# Patient Record
Sex: Female | Born: 1990 | Hispanic: Yes | Marital: Married | Smoking: Never smoker
Health system: Southern US, Community
[De-identification: ages and names within clinical notes are randomized; demographics above are authoritative.]

## PROBLEM LIST (undated history)

## (undated) DIAGNOSIS — Z789 Other specified health status: Secondary | ICD-10-CM

## (undated) DIAGNOSIS — D649 Anemia, unspecified: Secondary | ICD-10-CM

## (undated) HISTORY — DX: Other specified health status: Z78.9

## (undated) HISTORY — DX: Anemia, unspecified: D64.9

---
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
## 2010-08-26 ENCOUNTER — Ambulatory Visit: Payer: Self-pay | Admitting: Internal Medicine

## 2011-03-16 HISTORY — PX: CHOLECYSTECTOMY: SHX55

## 2014-06-22 ENCOUNTER — Emergency Department
Admit: 2014-06-22 | Disposition: A | Discharge status: Home / Self Care | Payer: Self-pay | Admitting: Emergency Medicine

## 2014-06-22 LAB — CBC WITH DIFFERENTIAL/PLATELET
Basophil #: 0 10*3/uL (ref 0.0–0.1)
Basophil %: 0.4 %
EOS ABS: 0.1 10*3/uL (ref 0.0–0.7)
Eosinophil %: 0.9 %
HCT: 41.2 % (ref 35.0–47.0)
HGB: 13.6 g/dL (ref 12.0–16.0)
Lymphocyte #: 5.4 10*3/uL — ABNORMAL HIGH (ref 1.0–3.6)
Lymphocyte %: 49.4 %
MCH: 30 pg (ref 26.0–34.0)
MCHC: 33 g/dL (ref 32.0–36.0)
MCV: 91 fL (ref 80–100)
MONOS PCT: 5.9 %
Monocyte #: 0.6 x10 3/mm (ref 0.2–0.9)
NEUTROS PCT: 43.4 %
Neutrophil #: 4.7 10*3/uL (ref 1.4–6.5)
Platelet: 231 10*3/uL (ref 150–440)
RBC: 4.53 10*6/uL (ref 3.80–5.20)
RDW: 12.6 % (ref 11.5–14.5)
WBC: 10.9 10*3/uL (ref 3.6–11.0)

## 2014-06-22 LAB — URINALYSIS, COMPLETE
Bacteria: NONE SEEN
Bilirubin,UR: NEGATIVE
Blood: NEGATIVE
Glucose,UR: NEGATIVE mg/dL (ref 0–75)
Ketone: NEGATIVE
Leukocyte Esterase: NEGATIVE
Nitrite: NEGATIVE
PROTEIN: NEGATIVE
Ph: 5 (ref 4.5–8.0)
SPECIFIC GRAVITY: 1.02 (ref 1.003–1.030)

## 2014-06-22 LAB — COMPREHENSIVE METABOLIC PANEL
ALBUMIN: 4.9 g/dL
ALK PHOS: 38 U/L
ALT: 18 U/L
ANION GAP: 6 — AB (ref 7–16)
BILIRUBIN TOTAL: 0.4 mg/dL
BUN: 15 mg/dL
CHLORIDE: 105 mmol/L
CO2: 24 mmol/L
CREATININE: 0.51 mg/dL
Calcium, Total: 9.4 mg/dL
EGFR (African American): >60
EGFR (Non-African Amer.): >60
Glucose: 103 mg/dL — ABNORMAL HIGH
Potassium: 3.4 mmol/L — ABNORMAL LOW
SGOT(AST): 19 U/L
Sodium: 135 mmol/L
Total Protein: 8.4 g/dL — ABNORMAL HIGH

## 2014-06-22 LAB — TROPONIN I: Troponin-I: <0.03 ng/mL

## 2014-06-22 LAB — WET PREP, GENITAL

## 2014-06-22 LAB — GC/CHLAMYDIA PROBE AMP

## 2014-06-26 ENCOUNTER — Ambulatory Visit
Admit: 2014-06-26 | Disposition: A | Discharge status: Home / Self Care | Payer: Self-pay | Attending: Internal Medicine | Admitting: Internal Medicine

## 2015-10-08 ENCOUNTER — Other Ambulatory Visit: Payer: Self-pay | Admitting: Family Medicine

## 2015-10-08 ENCOUNTER — Ambulatory Visit
Admission: RE | Admit: 2015-10-08 | Discharge: 2015-10-08 | Disposition: A | Discharge status: Home / Self Care | Payer: Commercial Managed Care - PPO | Source: Ambulatory Visit | Attending: Family Medicine | Admitting: Family Medicine

## 2015-10-08 DIAGNOSIS — R102 Pelvic and perineal pain: Secondary | ICD-10-CM

## 2016-03-15 NOTE — L&D Delivery Note (Signed)
Estimated Date of Delivery: 06/06/16 LMP: 08/31/15 EGA: [redacted]w[redacted]d  Delivery Note At 2:21 PM a viable female was delivered via Vaginal, Spontaneous Delivery (Presentation: LOA ).  APGAR and weight pending as of this note. Placenta status: spontaneous, intact.  Cord: 3 vessels, with the following complications: none.  Cord pH: not collected  Anesthesia:  epidural Episiotomy: None Lacerations:  Right labial Suture Repair: none Est. Blood Loss (mL):  300cc  Mom to postpartum.  Baby to Couplet care / Skin to Skin.  Brittney Robinson Brittney Robinson 05/20/2016, 2:33 PM      Mom presented to L&D with ROM, and without cervical change was augmented with pitocin.  epidual placed. Progressed to complete, second stage: <1hr.  delivery of fetal head with restitution to LOA.   Anterior then posterior shoulders delivered without difficulty.  Due to short cord, it was doubly clamped and cut by FOB.  Baby placed on mom's chest, and attended to by peds.  We sang happy birthday to baby [name].  Placenta spontaneously delivered, intact.   IV pitocin given for hemorrhage prophylaxis.  Right labial laceration, hemostatic, perineum intact.  No sutures.  ----- Larey Days, MD Attending Obstetrician and Gynecologist [facility], Department of [facility]

## 2016-04-22 DIAGNOSIS — Z531 Procedure and treatment not carried out because of patient's decision for reasons of belief and group pressure: Secondary | ICD-10-CM | POA: Insufficient documentation

## 2016-05-20 ENCOUNTER — Inpatient Hospital Stay: Payer: Commercial Managed Care - PPO | Admitting: Certified Registered Nurse Anesthetist

## 2016-05-20 ENCOUNTER — Encounter: Payer: Self-pay | Admitting: *Deleted

## 2016-05-20 ENCOUNTER — Inpatient Hospital Stay
Admission: EM | Admit: 2016-05-20 | Discharge: 2016-05-21 | DRG: 775 | Disposition: A | Discharge status: Home / Self Care | Payer: Commercial Managed Care - PPO | Attending: Obstetrics & Gynecology | Admitting: Obstetrics & Gynecology

## 2016-05-20 DIAGNOSIS — IMO0002 Reserved for concepts with insufficient information to code with codable children: Secondary | ICD-10-CM | POA: Diagnosis present

## 2016-05-20 DIAGNOSIS — Z3A37 37 weeks gestation of pregnancy: Secondary | ICD-10-CM

## 2016-05-20 DIAGNOSIS — O4202 Full-term premature rupture of membranes, onset of labor within 24 hours of rupture: Principal | ICD-10-CM | POA: Diagnosis present

## 2016-05-20 HISTORY — DX: Other specified health status: Z78.9

## 2016-05-20 LAB — CBC
HCT: 33.6 % — ABNORMAL LOW (ref 35.0–47.0)
Hemoglobin: 11.4 g/dL — ABNORMAL LOW (ref 12.0–16.0)
MCH: 28.7 pg (ref 26.0–34.0)
MCHC: 34 g/dL (ref 32.0–36.0)
MCV: 84.5 fL (ref 80.0–100.0)
Platelets: 245 K/uL (ref 150–440)
RBC: 3.97 MIL/uL (ref 3.80–5.20)
RDW: 15.9 % — ABNORMAL HIGH (ref 11.5–14.5)
WBC: 9.9 K/uL (ref 3.6–11.0)

## 2016-05-20 LAB — RAPID HIV SCREEN (HIV 1/2 AB+AG)
HIV 1/2 Antibodies: NONREACTIVE
HIV-1 P24 Antigen - HIV24: NONREACTIVE

## 2016-05-20 LAB — TYPE AND SCREEN
ABO/RH(D): B POS
Antibody Screen: NEGATIVE

## 2016-05-20 MED ORDER — ONDANSETRON HCL 4 MG/2ML IJ SOLN
4.0000 mg | Freq: Four times a day (QID) | INTRAMUSCULAR | Status: DC | PRN
  Filled 2016-05-20: qty 2

## 2016-05-20 MED ORDER — ACETAMINOPHEN 500 MG PO TABS: 1000.0000 mg | ORAL_TABLET | Freq: Four times a day (QID) | ORAL | Status: DC | PRN

## 2016-05-20 MED ORDER — SOD CITRATE-CITRIC ACID 500-334 MG/5ML PO SOLN
30.0000 mL | ORAL | Status: DC | PRN
  Filled 2016-05-20: qty 30

## 2016-05-20 MED ORDER — COCONUT OIL OIL
1.0000 "application " | TOPICAL_OIL | Status: DC | PRN
  Administered 2016-05-21: 1 via TOPICAL
  Filled 2016-05-20: qty 120

## 2016-05-20 MED ORDER — LIDOCAINE HCL (PF) 1 % IJ SOLN
INTRAMUSCULAR | Status: DC | PRN
  Administered 2016-05-20: 3 mL

## 2016-05-20 MED ORDER — LIDOCAINE HCL (PF) 1 % IJ SOLN: 30.0000 mL | INTRAMUSCULAR | Status: DC | PRN

## 2016-05-20 MED ORDER — SIMETHICONE 80 MG PO CHEW: 80.0000 mg | CHEWABLE_TABLET | ORAL | Status: DC | PRN

## 2016-05-20 MED ORDER — BUPIVACAINE HCL (PF) 0.25 % IJ SOLN
INTRAMUSCULAR | Status: DC | PRN
  Administered 2016-05-20 (×2): 4 mL via EPIDURAL

## 2016-05-20 MED ORDER — ONDANSETRON HCL 4 MG PO TABS
4.0000 mg | ORAL_TABLET | ORAL | Status: DC | PRN
Start: 2016-05-20 — End: 2016-05-21

## 2016-05-20 MED ORDER — DIPHENHYDRAMINE HCL 25 MG PO CAPS: 25.0000 mg | ORAL_CAPSULE | Freq: Four times a day (QID) | ORAL | Status: DC | PRN

## 2016-05-20 MED ORDER — LACTATED RINGERS IV SOLN: 500.0000 mL | INTRAVENOUS | Status: DC | PRN

## 2016-05-20 MED ORDER — IBUPROFEN 600 MG PO TABS
600.0000 mg | ORAL_TABLET | Freq: Four times a day (QID) | ORAL | Status: DC
  Administered 2016-05-20 – 2016-05-21 (×4): 600 mg via ORAL
  Filled 2016-05-20 (×4): qty 1

## 2016-05-20 MED ORDER — OXYTOCIN 40 UNITS IN LACTATED RINGERS INFUSION - SIMPLE MED
2.5000 [IU]/h | INTRAVENOUS | Status: DC
  Filled 2016-05-20: qty 1000

## 2016-05-20 MED ORDER — ACETAMINOPHEN 325 MG PO TABS: 650.0000 mg | ORAL_TABLET | ORAL | Status: DC | PRN

## 2016-05-20 MED ORDER — DIBUCAINE 1 % RE OINT: 1.0000 "application " | TOPICAL_OINTMENT | RECTAL | Status: DC | PRN

## 2016-05-20 MED ORDER — PRENATAL MULTIVITAMIN CH
1.0000 | ORAL_TABLET | Freq: Every day | ORAL | Status: DC
  Administered 2016-05-21: 1 via ORAL
  Filled 2016-05-20: qty 1

## 2016-05-20 MED ORDER — LACTATED RINGERS IV SOLN: INTRAVENOUS | Status: DC

## 2016-05-20 MED ORDER — OXYTOCIN BOLUS FROM INFUSION: 500.0000 mL | Freq: Once | INTRAVENOUS | Status: DC

## 2016-05-20 MED ORDER — FENTANYL 2.5 MCG/ML W/ROPIVACAINE 0.2% IN NS 100 ML EPIDURAL INFUSION (ARMC-ANES)
EPIDURAL | Status: AC
  Filled 2016-05-20: qty 100

## 2016-05-20 MED ORDER — FENTANYL 2.5 MCG/ML W/ROPIVACAINE 0.2% IN NS 100 ML EPIDURAL INFUSION (ARMC-ANES)
EPIDURAL | Status: DC | PRN
  Administered 2016-05-20: 10 mL/h via EPIDURAL

## 2016-05-20 MED ORDER — TETANUS-DIPHTH-ACELL PERTUSSIS 5-2.5-18.5 LF-MCG/0.5 IM SUSP: 0.5000 mL | Freq: Once | INTRAMUSCULAR | Status: DC

## 2016-05-20 MED ORDER — BUTORPHANOL TARTRATE 1 MG/ML IJ SOLN: 1.0000 mg | INTRAMUSCULAR | Status: DC | PRN

## 2016-05-20 MED ORDER — DOCUSATE SODIUM 100 MG PO CAPS
100.0000 mg | ORAL_CAPSULE | Freq: Two times a day (BID) | ORAL | Status: DC
  Administered 2016-05-20 – 2016-05-21 (×2): 100 mg via ORAL
  Filled 2016-05-20 (×2): qty 1

## 2016-05-20 MED ORDER — ONDANSETRON HCL 4 MG/2ML IJ SOLN
4.0000 mg | INTRAMUSCULAR | Status: DC | PRN
  Administered 2016-05-20: 4 mg via INTRAVENOUS

## 2016-05-20 MED ORDER — WITCH HAZEL-GLYCERIN EX PADS: 1.0000 "application " | MEDICATED_PAD | CUTANEOUS | Status: DC | PRN

## 2016-05-20 NOTE — Anesthesia Procedure Notes (Signed)
Epidural Patient location during procedure: OB Start time: 05/20/2016 12:15 PM End time: 05/20/2016 12:25 PM  Staffing Anesthesiologist: Alvin Critchley Resident/CRNA: Johnna Acosta Performed: resident/CRNA   Preanesthetic Checklist Completed: patient identified, site marked, surgical consent, pre-op evaluation, timeout performed, IV checked, risks and benefits discussed and monitors and equipment checked  Epidural Patient position: sitting Prep: Betadine Patient monitoring: heart rate, continuous pulse ox and blood pressure Approach: midline Location: L4-L5 Injection technique: LOR saline  Needle:  Needle type: Tuohy  Needle gauge: 17 G Needle length: 9 cm and 9 Needle insertion depth: 7 cm Catheter type: closed end flexible Catheter size: 19 Gauge Catheter at skin depth: 13 cm Test dose: negative and 1.5% lidocaine with Epi 1:200 K  Assessment Sensory level: T10 Events: blood not aspirated, injection not painful, no injection resistance, negative IV test and no paresthesia  Additional Notes Pt. Evaluated and documentation done after procedure finished. Patient identified. Risks/Benefits/Options discussed with patient including but not limited to bleeding, infection, nerve damage, paralysis, failed block, incomplete pain control, headache, blood pressure changes, nausea, vomiting, reactions to medication both or allergic, itching and postpartum back pain. Confirmed with bedside nurse the patient's most recent platelet count. Confirmed with patient that they are not currently taking any anticoagulation, have any bleeding history or any family history of bleeding disorders. Patient expressed understanding and wished to proceed. All questions were answered. Sterile technique was used throughout the entire procedure. Please see nursing notes for vital signs. Test dose was given through epidural catheter and negative prior to continuing to dose epidural or start infusion. Warning  signs of high block given to the patient including shortness of breath, tingling/numbness in hands, complete motor block, or any concerning symptoms with instructions to call for help. Patient was given instructions on fall risk and not to get out of bed. All questions and concerns addressed with instructions to call with any issues or inadequate analgesia.   Patient tolerated the insertion well without immediate complications.Reason for block:procedure for pain

## 2016-05-20 NOTE — H&P (Signed)
Brittney Robinson is a 26 y.o. female presenting for SROM at 0530 with pink tinged fluid. Pt is a JEHOVAHS WITNESS AND HAS DECLINED BLOOD IN CASE OF HEMORRHAGE! PNC at [facility] significant for unknown LMP with [place] on 10/08/15 with IUP at 5 3/7 weeks and EDD of 06/06/16. Son has been sick at home with fever and poss flu.  OB History    Gravida Para Term Preterm AB Living   2 1 1     1    SAB TAB Ectopic Multiple Live Births           1     Past Medical History:  Diagnosis Date  . Medical history non-contributory    Anemia Past Surgical History:  Procedure Laterality Date  . CHOLECYSTECTOMY     Family History: family history includes Heart disease in her maternal grandfather, maternal grandmother, paternal grandfather, and paternal grandmother. Social History:  reports that she has never smoked. She has never used smokeless tobacco. She reports that she does not drink alcohol or use drugs.     Maternal Diabetes: 38 Genetic Screening: declined Maternal Ultrasounds/Referrals: normal anatomy Fetal Ultrasounds or other Referrals: none Maternal Substance Abuse:  None Significant Maternal Medications: PNV, FE Significant Maternal Lab Results:NOne Other Comments:  Jehovah Witness  Review of Systems  Constitutional: Negative.   HENT: Negative.   Eyes: Negative.   Respiratory: Negative.   Cardiovascular: Negative.   Gastrointestinal: Negative.   Genitourinary: Negative.   Musculoskeletal: Negative.   Skin: Negative.   Neurological: Negative.   Endo/Heme/Allergies: Negative.   Psychiatric/Behavioral: Negative.    History   Exam Physical Exam  Gen: 26 yo Hispanic female in NAD. HEENT: Eyes non-icteric, Normocephalic Lungs: CTA bilat, no W/R/R. Heart: S1S2, RRR, No M/R/G Abd: Gravid Cx: 3cms per RN Extrems: 1+ edema. Prenatal labs: ABO, Rh:  B pos Antibody:  Neg Rubella:  Immune RPR:   NR HBsAg:   Neg HIV:   Neg GBS:   neg  Assessment/Plan: 1. IUP at term with  SROM 2. GBs neg 3. JEHOVAH WITNESS (DECLINES BLOOD) P: Admit to Birthplace for delivery 2. Continue to monitor UC/FHT 3. Continue to monitor VS. 4. Anticipate SVd.   Catheryn Bacon 05/20/2016, 8:05 AM

## 2016-05-20 NOTE — Discharge Summary (Signed)
Obstetrical Discharge Summary  Patient Name: Brittney Robinson DOB: 1990-06-25 MRN: [id number]   Date of Admission: 05/20/2016 Date of Delivery: 05/20/16 Delivered by: Larey Days Date of Discharge: 05/21/2016  Primary OB: [facility]   Gestational Age at Delivery: [redacted]w[redacted]d LMP 08/31/2015. Estimated Date of Delivery: 06/06/16    Antepartum complications: Jehovah's Witness, does not accept blood products.  Son recently diagnosed with flu Admitting Diagnosis: rupture of membranes Secondary Diagnosis: Patient Active Problem List   Diagnosis Date Noted  . Indication for care in labor or delivery 05/20/2016  . Rupture of membranes with clear amniotic fluid 05/20/2016    Augmentation: Pitocin Complications: None Intrapartum complications/course: Mom presented to L&D with ROM, and without cervical change was augmented with pitocin.  epidual placed. Progressed to complete, second stage: <1hr.  delivery of fetal head with restitution to LOA.   Anterior then posterior shoulders delivered without difficulty.  Due to short cord, it was doubly clamped and cut by FOB.  Baby placed on mom's chest, and attended to by peds.  We sang happy birthday to baby [name].  Placenta spontaneously delivered, intact.   IV pitocin given for hemorrhage prophylaxis.  Right labial laceration, hemostatic, perineum intact.  No sutures. Date of Delivery: 05/20/16 Delivered By: Vikki Ports Ward Delivery Type: spontaneous vaginal delivery Anesthesia: epidural Placenta: sponatneous Laceration: right labial, hemostatic Episiotomy: none Newborn Data: Live born female  Birth Weight:  6lb 8oz [phone]) APGAR: 9, 9  Postpartum Procedures: None  Post partum course:  Patient had an uncomplicated postpartum course.  By time of discharge on PPD#1, her pain was controlled on oral pain medications; she had appropriate lochia and was ambulating, voiding without difficulty and tolerating regular diet.  She was deemed stable  for discharge to home.    Discharge Physical Exam:  BP 104/67 (BP Location: Right Arm)   Pulse 88   Temp 98.4 F (36.9 C) (Oral)   Resp 18   Ht 4\' 9"  (1.448 m)   Wt 161 lb (73 kg)   LMP 08/31/2015   SpO2 99%   Breastfeeding? Unknown   BMI 34.84 kg/m   General: NAD CV: RRR Pulm: CTABL, nl effort ABD: s/nd/nt, fundus firm and below the umbilicus Lochia: moderate DVT Evaluation: LE non-ttp, no evidence of DVT on exam.  Hemoglobin  Date Value Ref Range Status  05/21/2016 10.7 (L) 12.0 - 16.0 g/dL Final   HGB  Date Value Ref Range Status  06/22/2014 13.6 12.0 - 16.0 g/dL Final   HCT  Date Value Ref Range Status  05/21/2016 32.2 (L) 35.0 - 47.0 % Final  06/22/2014 41.2 35.0 - 47.0 % Final     Disposition: stable, discharge to home. Baby Feeding: breastmilk Baby Disposition: home with mom  Rh Immune globulin given: n/a Rubella vaccine given: n/a Tdap vaccine given in AP or PP setting: AP Flu vaccine given in AP or PP setting: declined  Contraception: LARC vs interval BTL  Prenatal Labs:  ABO, Rh:  B pos Antibody:  Neg Rubella:  Immune RPR:   NR HBsAg:   Neg HIV:   Neg GBS:   neg   Plan:  Jenel Galindo-Palestino was discharged to home in good condition. Follow-up appointment with Dr. Leonides Schanz at [facility] in 6 weeks   Discharge Medications: Allergies as of 05/21/2016   No Known Allergies     Medication List    TAKE these medications   docusate sodium 100 MG capsule Commonly known as:  COLACE Take 1 capsule (100 mg  total) by mouth daily as needed for mild constipation. To keep stools soft   ferrous sulfate 325 (65 FE) MG tablet Take 325 mg by mouth daily with breakfast.   ibuprofen 800 MG tablet Commonly known as:  ADVIL,MOTRIN Take 1 tablet (800 mg total) by mouth every 8 (eight) hours as needed for moderate pain.   multivitamin-prenatal 27-0.8 MG Tabs tablet Take 1 tablet by mouth daily at 12 noon.         SignedBenjaman Kindler 6:03 PM

## 2016-05-20 NOTE — Anesthesia Preprocedure Evaluation (Signed)
Anesthesia Evaluation  Patient identified by MRN, date of birth, ID band Patient awake    Reviewed: Allergy & Precautions, H&P , NPO status , Patient's Chart, lab work & pertinent test results  Airway Mallampati: II  TM Distance: <3 FB   Mouth opening: Limited Mouth Opening  Dental  (+) Teeth Intact   Pulmonary    Pulmonary exam normal        Cardiovascular Exercise Tolerance: Good negative cardio ROS Normal cardiovascular exam     Neuro/Psych    GI/Hepatic negative GI ROS, Neg liver ROS,   Endo/Other  negative endocrine ROS  Renal/GU negative Renal ROS     Musculoskeletal   Abdominal   Peds  Hematology negative hematology ROS (+)   Anesthesia Other Findings   Reproductive/Obstetrics (+) Pregnancy                             Anesthesia Physical Anesthesia Plan  ASA: II  Anesthesia Plan: Epidural   Post-op Pain Management:    Induction:   Airway Management Planned:   Additional Equipment:   Intra-op Plan:   Post-operative Plan:   Informed Consent: I have reviewed the patients History and Physical, chart, labs and discussed the procedure including the risks, benefits and alternatives for the proposed anesthesia with the patient or authorized representative who has indicated his/her understanding and acceptance.   Dental Advisory Given  Plan Discussed with: Anesthesiologist  Anesthesia Plan Comments:         Anesthesia Quick Evaluation

## 2016-05-21 LAB — CBC
HEMATOCRIT: 32.2 % — AB (ref 35.0–47.0)
HEMOGLOBIN: 10.7 g/dL — AB (ref 12.0–16.0)
MCH: 28.5 pg (ref 26.0–34.0)
MCHC: 33.2 g/dL (ref 32.0–36.0)
MCV: 86 fL (ref 80.0–100.0)
Platelets: 188 10*3/uL (ref 150–440)
RBC: 3.75 MIL/uL — AB (ref 3.80–5.20)
RDW: 16.5 % — ABNORMAL HIGH (ref 11.5–14.5)
WBC: 6.7 10*3/uL (ref 3.6–11.0)

## 2016-05-21 LAB — RPR: RPR: NONREACTIVE

## 2016-05-21 MED ORDER — IBUPROFEN 800 MG PO TABS: 800.0000 mg | ORAL_TABLET | Freq: Three times a day (TID) | ORAL | 0 refills | Status: DC | PRN

## 2016-05-21 MED ORDER — DOCUSATE SODIUM 100 MG PO CAPS: 100.0000 mg | ORAL_CAPSULE | Freq: Every day | ORAL | 0 refills | Status: DC | PRN

## 2016-05-21 NOTE — Anesthesia Postprocedure Evaluation (Signed)
Anesthesia Post Note  Patient: Brittney Robinson  Procedure(s) Performed: * No procedures listed *  Patient location during evaluation: Mother Baby Anesthesia Type: Epidural Level of consciousness: awake, awake and alert and oriented Pain management: pain level controlled Vital Signs Assessment: post-procedure vital signs reviewed and stable Respiratory status: spontaneous breathing Cardiovascular status: stable Postop Assessment: no backache, patient able to bend at knees and adequate PO intake Anesthetic complications: no     Last Vitals:  Vitals:   05/20/16 2346 05/21/16 0432  BP: 128/64 (!) 105/57  Pulse: 97 83  Resp: 18 20  Temp: 36.8 C 37 C    Last Pain:  Vitals:   05/21/16 0432  TempSrc: Oral  PainSc:                  [name]

## 2016-05-21 NOTE — Progress Notes (Signed)
Post Partum Day #1 NSVD Subjective: no complaints, up ad lib, voiding and tolerating PO  Objective: Blood pressure 125/64, pulse 80, temperature 99.1 F (37.3 C), temperature source Oral, resp. rate 18, height 4\' 9"  (1.448 m), weight 161 lb (73 kg), last menstrual period 08/31/2015, SpO2 99 %, unknown if currently breastfeeding.  Physical Exam:  General: alert, cooperative and appears stated age Lochia: appropriate Uterine Fundus: firm Incision: none DVT Evaluation: No evidence of DVT seen on physical exam. Negative Homan's sign.   Recent Labs  05/20/16 0813 05/21/16 0501  HGB 11.4* 10.7*  HCT 33.6* 32.2*    Assessment/Plan: Plan for discharge tomorrow and Breastfeeding  Possible BTL   LOS: 1 day   Brittney Robinson 05/21/2016, 11:05 AM

## 2016-05-21 NOTE — Discharge Instructions (Signed)
Please call your doctor or return to the ER if you experience any chest pains, dizziness, shortness of breath, fever greater than 101, any heavy bleeding (saturating more than 1 pad per hour), large clots, or foul smelling discharge, any worsening abdominal pain and cramping that is not controlled by pain medication, or any signs of postpartum depression. No tampons, enemas, douches, or sexual intercourse for 6 weeks. Also avoid tub baths, hot tubs, or swimming for 6 weeks.    Care After Vaginal Delivery Congratulations on your new baby!!  Refer to this sheet in the next few weeks. These discharge instructions provide you with information on caring for yourself after delivery. Your caregiver may also give you specific instructions. Your treatment has been planned according to the most current medical practices available, but problems sometimes occur. Call your caregiver if you have any problems or questions after you go home.  HOME CARE INSTRUCTIONS  Take over-the-counter or prescription medicines only as directed by your caregiver or pharmacist.  Do not drink alcohol, especially if you are breastfeeding or taking medicine to relieve pain.  Do not chew or smoke tobacco.  Do not use illegal drugs.  Continue to use good perineal care. Good perineal care includes:  Wiping your perineum from front to back.  Keeping your perineum clean.  Do not use tampons or douche until your caregiver says it is okay.  Shower, wash your hair, and take tub baths as directed by your caregiver.  Wear a well-fitting bra that provides breast support.  Eat healthy foods.  Drink enough fluids to keep your urine clear or pale yellow.  Eat high-fiber foods such as whole grain cereals and breads, brown rice, beans, and fresh fruits and vegetables every day. These foods may help prevent or relieve constipation.  Follow your caregiver's recommendations regarding resumption of activities such as climbing stairs,  driving, lifting, exercising, or traveling. Specifically, no driving for two weeks, so that you are comfortable reacting quickly in an emergency.  Talk to your caregiver about resuming sexual activities. Resumption of sexual activities is dependent upon your risk of infection, your rate of healing, and your comfort and desire to resume sexual activity. Usually we recommend waiting about six weeks, or until your bleeding stops and you are interested in sex.  Try to have someone help you with your household activities and your newborn for at least a few days after you leave the hospital. Even longer is better.  Rest as much as possible. Try to rest or take a nap when your newborn is sleeping. Sleep deprivation can be very hard after delivery.  Increase your activities gradually.  Keep all of your scheduled postpartum appointments. It is very important to keep your scheduled follow-up appointments. At these appointments, your caregiver will be checking to make sure that you are healing physically and emotionally.  SEEK MEDICAL CARE IF:   You are passing large clots from your vagina.   You have a foul smelling discharge from your vagina.  You have trouble urinating.  You are urinating frequently.  You have pain when you urinate.  You have a change in your bowel movements.  You have increasing redness, pain, or swelling near your vaginal incision (episiotomy) or vaginal tear.  You have pus draining from your episiotomy or vaginal tear.  Your episiotomy or vaginal tear is separating.  You have painful, hard, or reddened breasts.  You have a severe headache.  You have blurred vision or see spots.  You feel  sad or depressed.  You have thoughts of hurting yourself or your newborn.  You have questions about your care, the care of your newborn, or medicines.  You are dizzy or light-headed.  You have a rash.  You have nausea or vomiting.  You were breastfeeding and have not had  a menstrual period within 12 weeks after you stopped breastfeeding.  You are not breastfeeding and have not had a menstrual period by the 12th week after delivery.  You have a fever.  SEEK IMMEDIATE MEDICAL CARE IF:   You have persistent pain.  You have chest pain.  You have shortness of breath.  You faint.  You have leg pain.  You have stomach pain.  Your vaginal bleeding saturates two or more sanitary pads in 1 hour.  MAKE SURE YOU:   Understand these instructions.  Will get help right away if you are not doing well or get worse.   Document Released: 02/27/2000 Document Revised: 07/16/2013 Document Reviewed: 10/27/2011  [facility] Patient Information 2015 [place], [place]. This information is not intended to replace advice given to you by your health care provider. Make sure you discuss any questions you have with your health care provider.

## 2016-05-21 NOTE — Progress Notes (Signed)
Discharge order received from doctor. Reviewed discharge instructions and prescriptions with patient and answered all questions. Follow up appointment instructions given. Patient verbalized understanding. ID bands checked. Patient discharged home with infant via wheelchair by nursing/auxillary.    Veronia Laprise Garner, RN  

## 2016-12-01 DIAGNOSIS — D649 Anemia, unspecified: Secondary | ICD-10-CM | POA: Insufficient documentation

## 2018-05-10 ENCOUNTER — Other Ambulatory Visit (HOSPITAL_COMMUNITY)
Admission: RE | Admit: 2018-05-10 | Discharge: 2018-05-10 | Disposition: A | Discharge status: Home / Self Care | Payer: Commercial Managed Care - PPO | Source: Ambulatory Visit | Attending: Family Medicine | Admitting: Family Medicine

## 2018-05-10 ENCOUNTER — Ambulatory Visit: Payer: Commercial Managed Care - PPO | Admitting: Family Medicine

## 2018-05-10 ENCOUNTER — Encounter: Payer: Self-pay | Admitting: Family Medicine

## 2018-05-10 VITALS — BP 120/76 | HR 99 | Temp 98.5°F | Resp 12 | Ht 58.5 in | Wt 138.3 lb

## 2018-05-10 DIAGNOSIS — Z1159 Encounter for screening for other viral diseases: Secondary | ICD-10-CM

## 2018-05-10 DIAGNOSIS — Z113 Encounter for screening for infections with a predominantly sexual mode of transmission: Secondary | ICD-10-CM | POA: Insufficient documentation

## 2018-05-10 DIAGNOSIS — Z131 Encounter for screening for diabetes mellitus: Secondary | ICD-10-CM | POA: Diagnosis not present

## 2018-05-10 DIAGNOSIS — Z01419 Encounter for gynecological examination (general) (routine) without abnormal findings: Secondary | ICD-10-CM | POA: Diagnosis not present

## 2018-05-10 DIAGNOSIS — Z130 Encounter for screening for diseases of the blood and blood-forming organs and certain disorders involving the immune mechanism: Secondary | ICD-10-CM

## 2018-05-10 DIAGNOSIS — Z13220 Encounter for screening for lipoid disorders: Secondary | ICD-10-CM

## 2018-05-10 DIAGNOSIS — N941 Unspecified dyspareunia: Secondary | ICD-10-CM

## 2018-05-10 DIAGNOSIS — D229 Melanocytic nevi, unspecified: Secondary | ICD-10-CM | POA: Insufficient documentation

## 2018-05-10 NOTE — Patient Instructions (Signed)
Preventive Care 18-39 Years, Female Preventive care refers to lifestyle choices and visits with your health care provider that can promote health and wellness. What does preventive care include?   A yearly physical exam. This is also called an annual well check.  Dental exams once or twice a year.  Routine eye exams. Ask your health care provider how often you should have your eyes checked.  Personal lifestyle choices, including: ? Daily care of your teeth and gums. ? Regular physical activity. ? Eating a healthy diet. ? Avoiding tobacco and drug use. ? Limiting alcohol use. ? Practicing safe sex. ? Taking vitamin and mineral supplements as recommended by your health care provider. What happens during an annual well check? The services and screenings done by your health care provider during your annual well check will depend on your age, overall health, lifestyle risk factors, and family history of disease. Counseling Your health care provider may ask you questions about your:  Alcohol use.  Tobacco use.  Drug use.  Emotional well-being.  Home and relationship well-being.  Sexual activity.  Eating habits.  Work and work environment.  Method of birth control.  Menstrual cycle.  Pregnancy history. Screening You may have the following tests or measurements:  Height, weight, and BMI.  Diabetes screening. This is done by checking your blood sugar (glucose) after you have not eaten for a while (fasting).  Blood pressure.  Lipid and cholesterol levels. These may be checked every 5 years starting at age 20.  Skin check.  Hepatitis C blood test.  Hepatitis B blood test.  Sexually transmitted disease (STD) testing.  BRCA-related cancer screening. This may be done if you have a family history of breast, ovarian, tubal, or peritoneal cancers.  Pelvic exam and Pap test. This may be done every 3 years starting at age 21. Starting at age 30, this may be done every 5  years if you have a Pap test in combination with an HPV test. Discuss your test results, treatment options, and if necessary, the need for more tests with your health care provider. Vaccines Your health care provider may recommend certain vaccines, such as:  Influenza vaccine. This is recommended every year.  Tetanus, diphtheria, and acellular pertussis (Tdap, Td) vaccine. You may need a Td booster every 10 years.  Varicella vaccine. You may need this if you have not been vaccinated.  HPV vaccine. If you are 26 or younger, you may need three doses over 6 months.  Measles, mumps, and rubella (MMR) vaccine. You may need at least one dose of MMR. You may also need a second dose.  Pneumococcal 13-valent conjugate (PCV13) vaccine. You may need this if you have certain conditions and were not previously vaccinated.  Pneumococcal polysaccharide (PPSV23) vaccine. You may need one or two doses if you smoke cigarettes or if you have certain conditions.  Meningococcal vaccine. One dose is recommended if you are age 19-21 years and a first-year college student living in a residence hall, or if you have one of several medical conditions. You may also need additional booster doses.  Hepatitis A vaccine. You may need this if you have certain conditions or if you travel or work in places where you may be exposed to hepatitis A.  Hepatitis B vaccine. You may need this if you have certain conditions or if you travel or work in places where you may be exposed to hepatitis B.  Haemophilus influenzae type b (Hib) vaccine. You may need this if you   have certain risk factors. Talk to your health care provider about which screenings and vaccines you need and how often you need them. This information is not intended to replace advice given to you by your health care provider. Make sure you discuss any questions you have with your health care provider. Document Released: 04/27/2001 Document Revised: 10/12/2016  Document Reviewed: 12/31/2014 Elsevier Interactive Patient Education  2019 [employer].

## 2018-05-10 NOTE — Progress Notes (Signed)
Name: Brittney Robinson   MRN: [id number]    DOB: 02-17-1991   Date:05/10/2018       Progress Note  Subjective  Chief Complaint  Chief Complaint  Patient presents with  . Establish Care    HPI   Patient presents for annual CPE   Diet: high in carbohydrates, discussed more vegetables , change to fruit instead of dessert  Exercise: needs to exercise 150 minutes per week   USPSTF grade A and B recommendations    Office Visit from 05/10/2018 in [facility]  AUDIT-C Score  0     Depression:  Depression screen [facility] 2/9 05/10/2018  Decreased Interest 0  Down, Depressed, Hopeless 0  PHQ - 2 Score 0  Altered sleeping 0  Tired, decreased energy 1  Change in appetite 1  Feeling bad or failure about yourself  0  Trouble concentrating 0  Moving slowly or fidgety/restless 0  Suicidal thoughts 0  PHQ-9 Score 2  Difficult doing work/chores Not difficult at all   Hypertension: BP Readings from Last 3 Encounters:  05/10/18 120/76  05/21/16 104/67   Obesity: Wt Readings from Last 3 Encounters:  05/10/18 138 lb 4.8 oz (62.7 kg)  05/20/16 161 lb (73 kg)   BMI Readings from Last 3 Encounters:  05/10/18 28.41 kg/m  05/20/16 34.84 kg/m    Hep C Screening: today  STD testing and prevention (HIV/chl/gon/syphilis): today  Intimate partner violence: negative screen  Sexual History/Pain during Intercourse: she states it is painful during intercourse, worse since delivery of last child 2 years ago, discussed referring her back Menstrual History/LMP/Abnormal Bleeding: today, regular cycles Incontinence Symptoms: negative   Advanced Care Planning: A voluntary discussion about advance care planning including the explanation and discussion of advance directives.  Discussed health care proxy and Living will, and the patient was able to identify a health care proxy as husband .  Patient does not have a living will at present time.   BRCA gene screening:  N/A Cervical cancer screening: up to date  Osteoporosis Screening: discussed high calcium and vitamin  D diet    Glucose:  Glucose  Date Value Ref Range Status  06/22/2014 103 (H) mg/dL Final    Comment:    65-99 NOTE: New Reference Range  05/21/14     Skin cancer: discussed atypical lesions    Patient Active Problem List   Diagnosis Date Noted  . Chronic anemia 12/01/2016  . Refusal of blood transfusions as patient is Jehovah's Witness 04/22/2016    Past Surgical History:  Procedure Laterality Date  . CHOLECYSTECTOMY      Family History  Problem Relation Age of Onset  . Heart disease Maternal Grandmother   . Heart disease Maternal Grandfather   . Heart disease Paternal Grandmother   . Hypertension Paternal Grandmother   . Heart disease Paternal Grandfather   . Allergies Father   . Bipolar disorder Sister   . Obesity Maternal Aunt   . Hypertension Maternal Aunt   . Schizophrenia Maternal Aunt   . Obesity Paternal Uncle     Social History   Socioeconomic History  . Marital status: Married    Spouse name: Jose   . Number of children: 2  . Years of education: Not on file  . Highest education level: 12th grade  Occupational History  . Occupation: CNA  Social Needs  . Financial resource strain: Not hard at all  . Food insecurity:    Worry: Never true    Inability:  Never true  . Transportation needs:    Medical: No    Non-medical: No  Tobacco Use  . Smoking status: Never Smoker  . Smokeless tobacco: Never Used  Substance and Sexual Activity  . Alcohol use: No  . Drug use: No  . Sexual activity: Yes    Partners: Male    Birth control/protection: Condom  Lifestyle  . Physical activity:    Days per week: 0 days    Minutes per session: 0 min  . Stress: Not at all  Relationships  . Social connections:    Talks on phone: More than three times a week    Gets together: More than three times a week    Attends religious service: More than 4 times per  year    Active member of club or organization: Yes    Attends meetings of clubs or organizations: More than 4 times per year    Relationship status: Married  . Intimate partner violence:    Fear of current or ex partner: No    Emotionally abused: No    Physically abused: No    Forced sexual activity: No  Other Topics Concern  . Not on file  Social History Narrative   Born in [place], both parents from [place].    Moved to [place] with parents around age 10   She [place] in [place] from [place] and they have 2 sons    No current outpatient medications on file.  No Known Allergies   ROS  Constitutional: Negative for fever or weight change.  Respiratory: positive  for cough ( past couple of weeks but improving - had an URI)  But no  shortness of breath.   Cardiovascular: Negative for chest pain or palpitations.  Gastrointestinal: Negative for abdominal pain, no bowel changes.  Musculoskeletal: Negative for gait problem or joint swelling.  Skin: Negative for rash.  Neurological: Negative for dizziness or headache.  No other specific complaints in a complete review of systems (except as listed in HPI above).  Objective  Vitals:   05/10/18 1114  BP: 120/76  Pulse: 99  Resp: 12  Temp: 98.5 F (36.9 C)  TempSrc: Oral  SpO2: 98%  Weight: 138 lb 4.8 oz (62.7 kg)  Height: 4' 10.5" (1.486 m)    Body mass index is 28.41 kg/m.  Physical Exam  Constitutional: Patient appears well-developed and well-nourished. No distress.  HENT: Head: Normocephalic and atraumatic. Ears: B TMs ok, no erythema or effusion; Nose: Nose normal. Mouth/Throat: Oropharynx is clear and moist. No oropharyngeal exudate.  Eyes: Conjunctivae and EOM are normal. Pupils are equal, round, and reactive to light. No scleral icterus.  Neck: Normal range of motion. Neck supple. No JVD present. No thyromegaly present.  Cardiovascular: Normal rate, regular rhythm and normal heart sounds.  No murmur heard. No BLE  edema. Pulmonary/Chest: Effort normal and breath sounds normal. No respiratory distress. Abdominal: Soft. Bowel sounds are normal, no distension. There is no tenderness. no masses Breast: no lumps or masses, no nipple discharge or rashes FEMALE GENITALIA:  Not done on her cycle  RECTAL:not done  Musculoskeletal: Normal range of motion, no joint effusions. No gross deformities Neurological: he is alert and oriented to person, place, and time. No cranial nerve deficit. Coordination, balance, strength, speech and gait are normal.  Skin: Skin is warm and dry. No rash noted. No erythema. She has a large mole on her right inner arm, irregular shape, discussed referral to dermatologist but she states it   is a birth mark  Psychiatric: Patient has a normal mood and affect. behavior is normal. Judgment and thought content normal.  PHQ2/9: Depression screen PHQ 2/9 05/10/2018  Decreased Interest 0  Down, Depressed, Hopeless 0  PHQ - 2 Score 0  Altered sleeping 0  Tired, decreased energy 1  Change in appetite 1  Feeling bad or failure about yourself  0  Trouble concentrating 0  Moving slowly or fidgety/restless 0  Suicidal thoughts 0  PHQ-9 Score 2  Difficult doing work/chores Not difficult at all     Fall Risk: Fall Risk  05/10/2018  Falls in the past year? 0  Number falls in past yr: 0  Injury with Fall? 0     Functional Status Survey: Is the patient deaf or have difficulty hearing?: No Does the patient have difficulty seeing, even when wearing glasses/contacts?: No Does the patient have difficulty concentrating, remembering, or making decisions?: No Does the patient have difficulty walking or climbing stairs?: No Does the patient have difficulty dressing or bathing?: No Does the patient have difficulty doing errands alone such as visiting a doctor's office or shopping?: No  Assessment & Plan  1. Well woman exam  - COMPLETE METABOLIC PANEL WITH GFR  2. Lipid screening  - Lipid  panel  3. Diabetes mellitus screening  - Hemoglobin A1c  4. Screening for deficiency anemia  - CBC with Differential/Platelet  5. Need for hepatitis C screening test  - Hepatitis C Antibody  6. Routine screening for STI (sexually transmitted infection)  Patient requested  - RPR - HIV Antibody (routine testing w rflx - GC  7. Dyspareunia, female  - Ambulatory referral to Obstetrics / Gynecology -USPSTF grade A and B recommendations reviewed with patient; age-appropriate recommendations, preventive care, screening tests, etc discussed and encouraged; healthy living encouraged; see AVS for patient education given to patient -Discussed importance of 150 minutes of physical activity weekly, eat two servings of fish weekly, eat one serving of tree nuts ( cashews, pistachios, pecans, almonds..) every other day, eat 6 servings of fruit/vegetables daily and drink plenty of water and avoid sweet beverages.    

## 2018-05-11 LAB — CBC WITH DIFFERENTIAL/PLATELET
Absolute Monocytes: 515 cells/uL (ref 200–950)
BASOS PCT: 0.5 %
Basophils Absolute: 51 cells/uL (ref 0–200)
EOS ABS: 152 {cells}/uL (ref 15–500)
Eosinophils Relative: 1.5 %
HEMATOCRIT: 39.7 % (ref 35.0–45.0)
HEMOGLOBIN: 13.7 g/dL (ref 11.7–15.5)
LYMPHS ABS: 3596 {cells}/uL (ref 850–3900)
MCH: 29.3 pg (ref 27.0–33.0)
MCHC: 34.5 g/dL (ref 32.0–36.0)
MCV: 85 fL (ref 80.0–100.0)
MPV: 10.3 fL (ref 7.5–12.5)
Monocytes Relative: 5.1 %
NEUTROS ABS: 5787 {cells}/uL (ref 1500–7800)
Neutrophils Relative %: 57.3 %
Platelets: 295 10*3/uL (ref 140–400)
RBC: 4.67 10*6/uL (ref 3.80–5.10)
RDW: 12.2 % (ref 11.0–15.0)
Total Lymphocyte: 35.6 %
WBC: 10.1 10*3/uL (ref 3.8–10.8)

## 2018-05-11 LAB — COMPLETE METABOLIC PANEL WITH GFR
AG Ratio: 1.5 (calc) (ref 1.0–2.5)
ALT: 16 U/L (ref 6–29)
AST: 14 U/L (ref 10–30)
Albumin: 4.7 g/dL (ref 3.6–5.1)
Alkaline phosphatase (APISO): 45 U/L (ref 31–125)
BUN: 13 mg/dL (ref 7–25)
CALCIUM: 9.6 mg/dL (ref 8.6–10.2)
CHLORIDE: 105 mmol/L (ref 98–110)
CO2: 25 mmol/L (ref 20–32)
Creat: 0.59 mg/dL (ref 0.50–1.10)
GFR, EST AFRICAN AMERICAN: 145 mL/min/{1.73_m2} (ref >=60)
GFR, EST NON AFRICAN AMERICAN: 125 mL/min/{1.73_m2} (ref >=60)
GLUCOSE: 81 mg/dL (ref 65–99)
Globulin: 3.2 g/dL (calc) (ref 1.9–3.7)
Potassium: 3.8 mmol/L (ref 3.5–5.3)
Sodium: 140 mmol/L (ref 135–146)
TOTAL PROTEIN: 7.9 g/dL (ref 6.1–8.1)
Total Bilirubin: 0.4 mg/dL (ref 0.2–1.2)

## 2018-05-11 LAB — HEMOGLOBIN A1C
HEMOGLOBIN A1C: 5.3 %{Hb} (ref <5.7)
Mean Plasma Glucose: 105 (calc)
eAG (mmol/L): 5.8 (calc)

## 2018-05-11 LAB — HIV ANTIBODY (ROUTINE TESTING W REFLEX): HIV: NONREACTIVE

## 2018-05-11 LAB — LIPID PANEL
Cholesterol: 144 mg/dL (ref <200)
HDL: 59 mg/dL (ref >=50)
LDL Cholesterol (Calc): 65 mg/dL (calc)
NON-HDL CHOLESTEROL (CALC): 85 mg/dL (ref <130)
Total CHOL/HDL Ratio: 2.4 (calc) (ref <5.0)
Triglycerides: 116 mg/dL (ref <150)

## 2018-05-11 LAB — RPR: RPR Ser Ql: NONREACTIVE

## 2018-05-11 LAB — HEPATITIS C ANTIBODY
HEP C AB: NONREACTIVE
SIGNAL TO CUT-OFF: 0.01 (ref <1.00)

## 2018-05-11 LAB — CYTOLOGY, (ORAL, ANAL, URETHRAL) ANCILLARY ONLY
Chlamydia: NEGATIVE
NEISSERIA GONORRHEA: NEGATIVE

## 2018-05-17 ENCOUNTER — Ambulatory Visit: Payer: Commercial Managed Care - PPO | Admitting: Obstetrics and Gynecology

## 2018-05-17 ENCOUNTER — Encounter: Payer: Self-pay | Admitting: Obstetrics and Gynecology

## 2018-05-17 ENCOUNTER — Other Ambulatory Visit: Payer: Self-pay

## 2018-05-17 ENCOUNTER — Other Ambulatory Visit (HOSPITAL_COMMUNITY)
Admission: RE | Admit: 2018-05-17 | Discharge: 2018-05-17 | Disposition: A | Discharge status: Home / Self Care | Payer: Commercial Managed Care - PPO | Source: Ambulatory Visit | Attending: Obstetrics and Gynecology | Admitting: Obstetrics and Gynecology

## 2018-05-17 VITALS — BP 100/62 | HR 76 | Ht 58.5 in | Wt 142.0 lb

## 2018-05-17 DIAGNOSIS — Z124 Encounter for screening for malignant neoplasm of cervix: Secondary | ICD-10-CM

## 2018-05-17 DIAGNOSIS — N941 Unspecified dyspareunia: Secondary | ICD-10-CM | POA: Insufficient documentation

## 2018-05-17 DIAGNOSIS — Z113 Encounter for screening for infections with a predominantly sexual mode of transmission: Secondary | ICD-10-CM

## 2018-05-17 LAB — POCT URINALYSIS DIPSTICK
APPEARANCE: NORMAL
BILIRUBIN UA: NEGATIVE
GLUCOSE UA: NEGATIVE
Ketones, UA: NEGATIVE
Leukocytes, UA: NEGATIVE
Nitrite, UA: NEGATIVE
Odor: NORMAL
Protein, UA: NEGATIVE
Spec Grav, UA: 1.015 (ref 1.010–1.025)
UROBILINOGEN UA: 0.2 U/dL
pH, UA: 6 (ref 5.0–8.0)

## 2018-05-17 NOTE — Progress Notes (Signed)
Obstetrics & Gynecology Office Visit   Chief Complaint  Patient presents with  . Referral    [facility] referring for dsypurinea x~2 yrs  Referral from Dr. Steele Sizer at [facility] for dyspareunia  History of Present Illness: 28 y.o. [id number] female who is seen in referral from Dr. Steele Sizer, MD, at [facility] for dyspareunia.  She states the pain with intercourse has been present for the past 2 years, since the birth of her last child.  She had some discomfort right after her first child and this was tolerable.  Since her second child she has had much worse pain, so much so that she avoids intercourse.  She notes the pain on initial penetration.  The pain is not as with deep penetration.  She has tried OTC lubricants.  But, this does not seem to work very much.  She also notes discomfort on the right side while she is active.  When she is less active, there is no issue. The entire outside sides and bottom are the worst.  Under normal circumstances she has no issues with itching and burning.  She denies changes in her discharge.  Her last pap smear was in 2016 and it was normal.  She was initially evaluated at [facility] and was told that it would go away (this was done at her OB/GYN right after delivery of her son).  She states that things haven gotten worse. She denies sore spots. Aggravating factors: working out, intercourse.  Alleviating factors: stopping intercourse, cold water.  Associated symptoms: denies bleeding or other symptoms associated. She had a recent abnormal period for her.  She notes a history of gonorrhea 3-4 years ago at [place]. She uses condoms for contraception. She states her pain is like a "stretch" feeling.   Past Medical History:  Diagnosis Date  . Medical history non-contributory   . No known health problems     Past Surgical History:  Procedure Laterality Date  . CHOLECYSTECTOMY      Gynecologic  History: Patient's last menstrual period was 05/10/2018 (exact date).  Obstetric History: [id number]  Family History  Problem Relation Age of Onset  . Heart disease Maternal Grandmother   . Heart disease Maternal Grandfather   . Heart disease Paternal Grandmother   . Hypertension Paternal Grandmother   . Heart disease Paternal Grandfather   . Allergies Father   . Bipolar disorder Sister   . Obesity Maternal Aunt   . Hypertension Maternal Aunt   . Schizophrenia Maternal Aunt   . Obesity Paternal Uncle     Social History   Socioeconomic History  . Marital status: Married    Spouse name: Jose   . Number of children: 2  . Years of education: Not on file  . Highest education level: 12th grade  Occupational History  . Occupation: CNA  Social Needs  . Financial resource strain: Not hard at all  . Food insecurity:    Worry: Never true    Inability: Never true  . Transportation needs:    Medical: No    Non-medical: No  Tobacco Use  . Smoking status: Never Smoker  . Smokeless tobacco: Never Used  Substance and Sexual Activity  . Alcohol use: No  . Drug use: No  . Sexual activity: Yes    Partners: Male    Birth control/protection: Condom  Lifestyle  . Physical activity:    Days per week: 0 days    Minutes per session: 0 min  .  Stress: Not at all  Relationships  . Social connections:    Talks on phone: More than three times a week    Gets together: More than three times a week    Attends religious service: More than 4 times per year    Active member of club or organization: Yes    Attends meetings of clubs or organizations: More than 4 times per year    Relationship status: Married  . Intimate partner violence:    Fear of current or ex partner: No    Emotionally abused: No    Physically abused: No    Forced sexual activity: No  Other Topics Concern  . Not on file  Social History Narrative   Born in [place], both parents from [place].    Moved to [place] with parents  around age 58   She married in [place] also from [place] and they have 2 sons   Allergies: No Known Allergies  Prior to Admission medications   Denies   Review of Systems  Constitutional: Negative.   HENT: Negative.   Eyes: Negative.   Respiratory: Negative.   Cardiovascular: Negative.   Gastrointestinal: Negative.   Genitourinary: Negative.        Dyspareunia  Musculoskeletal: Negative.   Skin: Negative.   Neurological: Negative.   Psychiatric/Behavioral: Negative.      Physical Exam BP 100/62 (BP Location: Left Arm, Patient Position: Sitting, Cuff Size: Normal)   Pulse 76   Ht 4' 10.5" (1.486 m)   Wt 142 lb (64.4 kg)   LMP 05/10/2018 (Exact Date)   BMI 29.17 kg/m  Patient's last menstrual period was 05/10/2018 (exact date). Physical Exam Constitutional:      Appearance: Normal appearance. She is not ill-appearing.  Genitourinary:     Pelvic exam was performed with patient in the lithotomy position.     Vagina, uterus, right adnexa and left adnexa normal.     Vulval tenderness present.     No vulval condylomata, lesion, ulcerations, Bartholin's cyst or rash noted.     No signs of labial injury.  No labial fusion.    Posterior fourchette tenderness present.     No posterior fourchette injury or rash present.     No urethral pain or tenderness present.        No cervical motion tenderness, discharge, lesion, bleeding or polyp.     Genitourinary Comments: Apparent CMT when pap smear collected. However, no CMT to palpation.  HENT:     Head: Normocephalic and atraumatic.  Eyes:     General: No scleral icterus.    Conjunctiva/sclera: Conjunctivae normal.  Neck:     Musculoskeletal: Normal range of motion and neck supple.  Cardiovascular:     Rate and Rhythm: Normal rate and regular rhythm.     Heart sounds: No murmur. No friction rub. No gallop.   Pulmonary:     Effort: Pulmonary effort is normal.     Breath sounds: Normal breath sounds. No wheezing, rhonchi or rales.   Abdominal:     General: There is no distension.     Palpations: Abdomen is soft.     Tenderness: There is no abdominal tenderness. There is no rebound.  Musculoskeletal: Normal range of motion.        General: No swelling.  Neurological:     General: No focal deficit present.     Mental Status: She is alert and oriented to person, place, and time.  Skin:    General: Skin is  warm and dry.  Psychiatric:        Mood and Affect: Mood normal.        Behavior: Behavior normal.        Judgment: Judgment normal.    Female chaperone present for pelvic and breast  portions of the physical exam  Wet Prep: PH: 4.5 Clue Cells: Negative Fungal elements: Negative Trichomonas: Negative   Assessment: 28 y.o. [id number] female here for  1. Dyspareunia in female   2. Pap smear for cervical cancer screening   3. Screen for STD (sexually transmitted disease)      Plan: Problem List Items Addressed This Visit    None    Visit Diagnoses    Dyspareunia in female    -  Primary   Relevant Orders   Cytology - PAP   Urine Culture   POCT Urinalysis Dipstick (Completed)   Ambulatory referral to Gynecology   Pap smear for cervical cancer screening       Relevant Orders   Cytology - PAP   Screen for STD (sexually transmitted disease)       Relevant Orders   Cytology - PAP     Dyspareunia: Will check for infectious etiologies for pain.  Pain appears to be mostly confined to the [place] area.  Discussed the frequently multi-factorial nature of this type of pain.  Discussed that finding an etiology and suitable treatment. Discussed referral to [facility] for Vulvar and Urogenital Pain.  Discussed expectations of finding treatment and that it might take time or multiple attempts at finding treatment. We further discussed that the treatment may be multi-pronged and require multiple types of pain treatments. She voiced understanding and agreement with the plan.  45 minutes spent in face to face  discussion with > 50% spent in counseling,management, and coordination of care of her dyspareunia.   Prentice Docker, MD 05/17/2018 3:12 PM

## 2018-05-18 LAB — CYTOLOGY - PAP
Chlamydia: NEGATIVE
Diagnosis: NEGATIVE
NEISSERIA GONORRHEA: NEGATIVE
Trichomonas: NEGATIVE

## 2018-05-19 LAB — URINE CULTURE

## 2018-05-24 ENCOUNTER — Encounter: Payer: Self-pay | Admitting: Obstetrics and Gynecology

## 2018-05-31 ENCOUNTER — Telehealth: Payer: Self-pay

## 2018-05-31 NOTE — Telephone Encounter (Signed)
Brittney Robinson from [facility] in [place] called stating they can't schedule this referral d/t Covid-19.If urgent/emergent concern and she needs to be scheduled please call the Division director, Dr. Othella Boyer, c# [phone].  Otherwise, they will keep referral in que and schedule when resume non urgent care.  Brittney Robinson's # [phone]

## 2018-06-02 NOTE — Telephone Encounter (Signed)
I think this is fine for now.  Unfortunate for patient, but is the right thing to do overall.

## 2018-07-07 ENCOUNTER — Encounter: Payer: Self-pay | Admitting: Family Medicine

## 2018-07-07 ENCOUNTER — Ambulatory Visit: Payer: Self-pay

## 2018-07-07 ENCOUNTER — Ambulatory Visit (INDEPENDENT_AMBULATORY_CARE_PROVIDER_SITE_OTHER): Payer: Commercial Managed Care - PPO | Admitting: Family Medicine

## 2018-07-07 ENCOUNTER — Other Ambulatory Visit: Payer: Self-pay

## 2018-07-07 DIAGNOSIS — R109 Unspecified abdominal pain: Secondary | ICD-10-CM | POA: Diagnosis not present

## 2018-07-07 DIAGNOSIS — R310 Gross hematuria: Secondary | ICD-10-CM | POA: Diagnosis not present

## 2018-07-07 DIAGNOSIS — Z841 Family history of disorders of kidney and ureter: Secondary | ICD-10-CM | POA: Diagnosis not present

## 2018-07-07 MED ORDER — NITROFURANTOIN MONOHYD MACRO 100 MG PO CAPS: 100.0000 mg | ORAL_CAPSULE | Freq: Two times a day (BID) | ORAL | 0 refills | Status: DC

## 2018-07-07 MED ORDER — NAPROXEN 500 MG PO TABS: 500.0000 mg | ORAL_TABLET | Freq: Two times a day (BID) | ORAL | 0 refills | Status: DC

## 2018-07-07 NOTE — Telephone Encounter (Signed)
Pt c/o mild bilateral flank pain and pink noted on toilet paper. Pt denies fever or burning with urination. Pt stated the pain comes and goes. Care advice given and pt verbalized understanding. Pt wanting virtual visit. Pt verified e-mail address. Call transferred to office.      Reason for Disposition . Blood in urine (red, pink, or tea-colored)  Answer Assessment - Initial Assessment Questions 1. LOCATION: "Where does it hurt?" (e.g., left, right)  both sides 2. ONSET: "When did the pain start?"     This morning 3. SEVERITY: "How bad is the pain?" (e.g., Scale 1-10; mild, moderate, or severe)   - MILD (1-3): doesn't interfere with normal activities    - MODERATE (4-7): interferes with normal activities or awakens from sleep    - SEVERE (8-10): excruciating pain and patient unable to do normal activities (stays in bed)       mild 4. PATTERN: "Does the pain come and go, or is it constant?"      Comes and goes  5. CAUSE: "What do you think is causing the pain?"     Pt doesn't know 6. OTHER SYMPTOMS:  "Do you have any other symptoms?" (e.g., fever, abdominal pain, vomiting, leg weakness, burning with urination, blood in urine)    Blood in urine 7. PREGNANCY:  "Is there any chance you are pregnant?" "When was your last menstrual period?"     "I dont think so." LMP: beginning of April 20  Protocols used: FLANK PAIN-A-AH

## 2018-07-07 NOTE — Progress Notes (Signed)
Name: Brittney Robinson   MRN: [id number]    DOB: 06/28/1990   Date:07/07/2018       Progress Note  Subjective  Chief Complaint  Chief Complaint  Patient presents with   Flank Pain    Onset-yesterday, left more but now has it on the right as well.    Hematuria    Yesterday- has like "string of blood in her urine"     I connected with  Zakeria Galindo-Palestino  on 07/07/18 at  2:20 PM EDT by a video enabled telemedicine application and verified that I am speaking with the correct person using two identifiers.  I discussed the limitations of evaluation and management by telemedicine and the availability of in person appointments. The patient expressed understanding and agreed to proceed. Staff also discussed with the patient that there may be a patient responsible charge related to this service. Patient Location: outisde her grandmothers house  Provider Location: [facility]    HPI  Left Flank pain: acute onset yesterday while at work, she states it was intense 7/10 localized on left flank, she also noticed some blood in her urine, no dysuria , fever or chills. She took Tylenol when she got home and pain decreased, this am she noticed some pain on the right flank, at this time, mild discomfort. No nausea, vomiting or change in appetite. Both of her sisters have a history of kidney stones.    Patient Active Problem List   Diagnosis Date Noted   Atypical mole 05/10/2018   Chronic anemia 12/01/2016   Refusal of blood transfusions as patient is Jehovah's Witness 04/22/2016    Past Surgical History:  Procedure Laterality Date   CHOLECYSTECTOMY  2013    Family History  Problem Relation Age of Onset   Heart disease Maternal Grandmother    Heart disease Maternal Grandfather    Heart disease Paternal Grandmother    Hypertension Paternal Grandmother    Heart disease Paternal Grandfather    Allergies Father    Bipolar disorder Sister    Kidney  Stones Sister    Obesity Maternal Aunt    Hypertension Maternal Aunt    Schizophrenia Maternal Aunt    Obesity Paternal Uncle    Kidney Stones Sister     Social History   Socioeconomic History   Marital status: Married    Spouse name: Jose    Number of children: 2   Years of education: Not on file   Highest education level: 12th grade  Occupational History   Occupation: CNA  Scientist, product/process development strain: Not hard at all   Food insecurity:    Worry: Never true    Inability: Never true   Transportation needs:    Medical: No    Non-medical: No  Tobacco Use   Smoking status: Never Smoker   Smokeless tobacco: Never Used  Substance and Sexual Activity   Alcohol use: No   Drug use: No   Sexual activity: Yes    Partners: Male    Birth control/protection: Condom  Lifestyle   Physical activity:    Days per week: 0 days    Minutes per session: 0 min   Stress: Not at all  Relationships   Social connections:    Talks on phone: More than three times a week    Gets together: More than three times a week    Attends religious service: More than 4 times per year    Active member of club or  organization: Yes    Attends meetings of clubs or organizations: More than 4 times per year    Relationship status: Married   Intimate partner violence:    Fear of current or ex partner: No    Emotionally abused: No    Physically abused: No    Forced sexual activity: No  Other Topics Concern   Not on file  Social History Narrative   Born in [place], both parents from [place].    Moved to [place] with parents around age 29   She married in [place] also from [place] and they have 2 sons    No current outpatient medications on file.  No Known Allergies  I personally reviewed active problem list, medication list, allergies, family history, social history with the patient/caregiver today.   ROS  Ten systems reviewed and is negative except as mentioned in HPI    Objective  Virtual encounter, vitals not obtained.  There is no height or weight on file to calculate BMI.  Physical Exam  Awake, alert and oriented, she pointed the area of pain, left flank, no abdominal pain. In no distress during call   PHQ2/9: Depression screen [facility] 2/9 07/07/2018 05/10/2018  Decreased Interest 0 0  Down, Depressed, Hopeless 0 0  PHQ - 2 Score 0 0  Altered sleeping 0 0  Tired, decreased energy 0 1  Change in appetite 0 1  Feeling bad or failure about yourself  0 0  Trouble concentrating 0 0  Moving slowly or fidgety/restless 0 0  Suicidal thoughts 0 0  PHQ-9 Score 0 2  Difficult doing work/chores Not difficult at all Not difficult at all   PHQ-2/9 Result is negative.    Fall Risk: Fall Risk  07/07/2018 05/10/2018  Falls in the past year? 0 0  Number falls in past yr: 0 0  Injury with Fall? 0 0    Assessment & Plan  1. Acute left flank pain  - nitrofurantoin, macrocrystal-monohydrate, (MACROBID) 100 MG capsule; Take 1 capsule (100 mg total) by mouth 2 (two) times daily.  Dispense: 10 capsule; Refill: 0 - naproxen (NAPROSYN) 500 MG tablet; Take 1 tablet (500 mg total) by mouth 2 (two) times daily with a meal.  Dispense: 30 tablet; Refill: 0  2. Gross hematuria  She will return Monday for urine culture  Discussed importance of staying hydrated, straining urine at home, monitor for fever, chills or increase in pain and to go to [facility] if needed   3. Family history of kidney stones   I discussed the assessment and treatment plan with the patient. The patient was provided an opportunity to ask questions and all were answered. The patient agreed with the plan and demonstrated an understanding of the instructions.  The patient was advised to call back or seek an in-person evaluation if the symptoms worsen or if the condition fails to improve as anticipated.  I provided 15  minutes of non-face-to-face time during this encounter.

## 2018-07-10 ENCOUNTER — Ambulatory Visit: Payer: Commercial Managed Care - PPO | Admitting: Family Medicine

## 2018-07-10 ENCOUNTER — Other Ambulatory Visit: Payer: Self-pay

## 2018-07-10 ENCOUNTER — Encounter: Payer: Self-pay | Admitting: Family Medicine

## 2018-07-10 VITALS — BP 106/70 | HR 82 | Temp 98.0°F | Resp 16 | Ht 58.5 in | Wt 143.0 lb

## 2018-07-10 DIAGNOSIS — R319 Hematuria, unspecified: Secondary | ICD-10-CM

## 2018-07-10 DIAGNOSIS — R1031 Right lower quadrant pain: Secondary | ICD-10-CM

## 2018-07-10 DIAGNOSIS — Z3200 Encounter for pregnancy test, result unknown: Secondary | ICD-10-CM

## 2018-07-10 DIAGNOSIS — R109 Unspecified abdominal pain: Secondary | ICD-10-CM | POA: Diagnosis not present

## 2018-07-10 DIAGNOSIS — Z841 Family history of disorders of kidney and ureter: Secondary | ICD-10-CM | POA: Diagnosis not present

## 2018-07-10 LAB — POCT URINALYSIS DIPSTICK
Bilirubin, UA: NEGATIVE
Glucose, UA: NEGATIVE
Ketones, UA: NEGATIVE
Leukocytes, UA: NEGATIVE
Nitrite, UA: NEGATIVE
Protein, UA: NEGATIVE
Spec Grav, UA: 1.015 (ref 1.010–1.025)
Urobilinogen, UA: NEGATIVE E.U./dL — AB
pH, UA: 5 (ref 5.0–8.0)

## 2018-07-10 LAB — POCT URINE PREGNANCY: Preg Test, Ur: NEGATIVE

## 2018-07-10 NOTE — Progress Notes (Signed)
Name: Brittney Robinson   MRN: [id number]    DOB: March 07, 1991   Date:07/10/2018       Progress Note  Subjective  Chief Complaint  Chief Complaint  Patient presents with  . Urinary Tract Infection    possible    HPI  Patient is here today for a follow up from telemedicine visit done 3 days ago. She has a significant family history of kidney stones and noticed blood in her urine and left flank pain last week, followed by right flank pain. She denies fever or chills. She has normal appetite, pain is not aggravated by activity. No longer having gross hematuria but urine has been darker than usual, no dysuria. She is taking antibiotics. Today urine dip was positive for blood   Patient Active Problem List   Diagnosis Date Noted  . Atypical mole 05/10/2018  . Chronic anemia 12/01/2016  . Refusal of blood transfusions as patient is Jehovah's Witness 04/22/2016    Past Surgical History:  Procedure Laterality Date  . CHOLECYSTECTOMY  2013    Family History  Problem Relation Age of Onset  . Heart disease Maternal Grandmother   . Heart disease Maternal Grandfather   . Heart disease Paternal Grandmother   . Hypertension Paternal Grandmother   . Heart disease Paternal Grandfather   . Allergies Father   . Bipolar disorder Sister   . Kidney Stones Sister   . Obesity Maternal Aunt   . Hypertension Maternal Aunt   . Schizophrenia Maternal Aunt   . Obesity Paternal Uncle   . Kidney Stones Sister     Social History   Socioeconomic History  . Marital status: Married    Spouse name: Jose   . Number of children: 2  . Years of education: Not on file  . Highest education level: 12th grade  Occupational History  . Occupation: CNA  Social Needs  . Financial resource strain: Not hard at all  . Food insecurity:    Worry: Never true    Inability: Never true  . Transportation needs:    Medical: No    Non-medical: No  Tobacco Use  . Smoking status: Never Smoker  . Smokeless  tobacco: Never Used  Substance and Sexual Activity  . Alcohol use: No  . Drug use: No  . Sexual activity: Yes    Partners: Male    Birth control/protection: Condom  Lifestyle  . Physical activity:    Days per week: 0 days    Minutes per session: 0 min  . Stress: Not at all  Relationships  . Social connections:    Talks on phone: More than three times a week    Gets together: More than three times a week    Attends religious service: More than 4 times per year    Active member of club or organization: Yes    Attends meetings of clubs or organizations: More than 4 times per year    Relationship status: Married  . Intimate partner violence:    Fear of current or ex partner: No    Emotionally abused: No    Physically abused: No    Forced sexual activity: No  Other Topics Concern  . Not on file  Social History Narrative   Born in [place], both parents from [place].    Moved to [place] with parents around age 9   She married in [place] also from [place] and they have 2 sons     Current Outpatient Medications:  .  naproxen (  NAPROSYN) 500 MG tablet, Take 1 tablet (500 mg total) by mouth 2 (two) times daily with a meal., Disp: 30 tablet, Rfl: 0 .  nitrofurantoin, macrocrystal-monohydrate, (MACROBID) 100 MG capsule, Take 1 capsule (100 mg total) by mouth 2 (two) times daily., Disp: 10 capsule, Rfl: 0  No Known Allergies  I personally reviewed active problem list, medication list, allergies, family history with the patient/caregiver today.   ROS  Ten systems reviewed and is negative except as mentioned in HPI   Objective  Vitals:   07/10/18 1151  BP: 106/70  Pulse: 82  Resp: 16  Temp: 98 F (36.7 C)  TempSrc: Oral  SpO2: 99%  Weight: 143 lb (64.9 kg)  Height: 4' 10.5" (1.486 m)    Body mass index is 29.38 kg/m.  Physical Exam  Constitutional: Patient appears well-developed and well-nourished. Overweight. No distress.  HEENT: head atraumatic, normocephalic, pupils equal  and reactive to light, neck supple, throat within normal limits Cardiovascular: Normal rate, regular rhythm and normal heart sounds.  No murmur heard. No BLE edema. Pulmonary/Chest: Effort normal and breath sounds normal. No respiratory distress. Abdominal: Soft.  There is right lower quadrant pain , left CVA tenderness   Psychiatric: Patient has a normal mood and affect. behavior is normal. Judgment and thought content normal.  Recent Results (from the past 2160 hour(s))  Lipid panel     Status: None   Collection Time: 05/10/18 12:00 AM  Result Value Ref Range   Cholesterol 144 <200 mg/dL   HDL 59 > OR = 50 mg/dL   Triglycerides 116 <150 mg/dL   LDL Cholesterol (Calc) 65 mg/dL (calc)    Comment: Reference range: <100 . Desirable range <100 mg/dL for primary prevention;   <70 mg/dL for patients with CHD or diabetic patients  with > or = 2 CHD risk factors. Marland Kitchen LDL-C is now calculated using the Martin-Hopkins  calculation, which is a validated novel method providing  better accuracy than the Friedewald equation in the  estimation of LDL-C.  Cresenciano Genre et al. Annamaria Helling. 5027;741(28): 2061-2068  (http://education.[facility].com/faq/FAQ164)    Total CHOL/HDL Ratio 2.4 <5.0 (calc)   Non-HDL Cholesterol (Calc) 85 <130 mg/dL (calc)    Comment: For patients with diabetes plus 1 major ASCVD risk  factor, treating to a non-HDL-C goal of <100 mg/dL  (LDL-C of <70 mg/dL) is considered a therapeutic  option.   COMPLETE METABOLIC PANEL WITH GFR     Status: None   Collection Time: 05/10/18 12:00 AM  Result Value Ref Range   Glucose, Bld 81 65 - 99 mg/dL    Comment: .            Fasting reference interval .    BUN 13 7 - 25 mg/dL   Creat 0.59 0.50 - 1.10 mg/dL   GFR, Est Non African American 125 > OR = 60 mL/min/1.70m2   GFR, Est African American 145 > OR = 60 mL/min/1.76m2   BUN/Creatinine Ratio NOT APPLICABLE 6 - 22 (calc)   Sodium 140 135 - 146 mmol/L   Potassium 3.8 3.5 - 5.3 mmol/L    Chloride 105 98 - 110 mmol/L   CO2 25 20 - 32 mmol/L   Calcium 9.6 8.6 - 10.2 mg/dL   Total Protein 7.9 6.1 - 8.1 g/dL   Albumin 4.7 3.6 - 5.1 g/dL   Globulin 3.2 1.9 - 3.7 g/dL (calc)   AG Ratio 1.5 1.0 - 2.5 (calc)   Total Bilirubin 0.4 0.2 - 1.2 mg/dL  Alkaline phosphatase (APISO) 45 31 - 125 U/L   AST 14 10 - 30 U/L   ALT 16 6 - 29 U/L  CBC with Differential/Platelet     Status: None   Collection Time: 05/10/18 12:00 AM  Result Value Ref Range   WBC 10.1 3.8 - 10.8 Thousand/uL   RBC 4.67 3.80 - 5.10 Million/uL   Hemoglobin 13.7 11.7 - 15.5 g/dL   HCT 39.7 35.0 - 45.0 %   MCV 85.0 80.0 - 100.0 fL   MCH 29.3 27.0 - 33.0 pg   MCHC 34.5 32.0 - 36.0 g/dL   RDW 12.2 11.0 - 15.0 %   Platelets 295 140 - 400 Thousand/uL   MPV 10.3 7.5 - 12.5 fL   Neutro Abs 5,787 1,500 - 7,800 cells/uL   Lymphs Abs 3,596 850 - 3,900 cells/uL   Absolute Monocytes 515 200 - 950 cells/uL   Eosinophils Absolute 152 15 - 500 cells/uL   Basophils Absolute 51 0 - 200 cells/uL   Neutrophils Relative % 57.3 %   Total Lymphocyte 35.6 %   Monocytes Relative 5.1 %   Eosinophils Relative 1.5 %   Basophils Relative 0.5 %  Hemoglobin A1c     Status: None   Collection Time: 05/10/18 12:00 AM  Result Value Ref Range   Hgb A1c MFr Bld 5.3 <5.7 % of total Hgb    Comment: For the purpose of screening for the presence of diabetes: . <5.7%       Consistent with the absence of diabetes 5.7-6.4%    Consistent with increased risk for diabetes             (prediabetes) > or =6.5%  Consistent with diabetes . This assay result is consistent with a decreased risk of diabetes. . Currently, no consensus exists regarding use of hemoglobin A1c for diagnosis of diabetes in children. . According to American Diabetes Association (ADA) guidelines, hemoglobin A1c <7.0% represents optimal control in non-pregnant diabetic patients. Different metrics may apply to specific patient populations.  Standards of Medical Care  in Diabetes(ADA). .    Mean Plasma Glucose 105 (calc)   eAG (mmol/L) 5.8 (calc)  Cytology (oral, anal, urethral) ancillary only     Status: None   Collection Time: 05/10/18 12:00 AM  Result Value Ref Range   Chlamydia Negative     Comment: Normal Reference Range - Negative   Neisseria gonorrhea Negative     Comment: Normal Reference Range - Negative  Hepatitis C antibody     Status: None   Collection Time: 05/10/18 12:00 AM  Result Value Ref Range   Hepatitis C Ab NON-REACTIVE NON-REACTI   SIGNAL TO CUT-OFF 0.01 <1.00    Comment: . HCV antibody was non-reactive. There is no laboratory  evidence of HCV infection. . In most cases, no further action is required. However, if recent HCV exposure is suspected, a test for HCV RNA (test code [id number]) is suggested. . For additional information please refer to http://education.[facility].com/faq/FAQ22v1 (This link is being provided for informational/ educational purposes only.) .   HIV Antibody (routine testing w rflx)     Status: None   Collection Time: 05/10/18 12:00 AM  Result Value Ref Range   HIV 1&2 Ab, 4th Generation NON-REACTIVE NON-REACTI    Comment: HIV-1 antigen and HIV-1/HIV-2 antibodies were not detected. There is no laboratory evidence of HIV infection. Marland Kitchen PLEASE NOTE: This information has been disclosed to you from records whose confidentiality may be protected by state law.  If  your state requires such protection, then the state law prohibits you from making any further disclosure of the information without the specific written consent of the person to whom it pertains, or as otherwise permitted by law. A general authorization for the release of medical or other information is NOT sufficient for this purpose. . For additional information please refer to http://education.[facility].com/faq/FAQ106 (This link is being provided for informational/ educational purposes only.) . Marland Kitchen The performance of this  assay has not been clinically validated in patients less than 91 years old. .   RPR     Status: None   Collection Time: 05/10/18 12:00 AM  Result Value Ref Range   RPR Ser Ql NON-REACTIVE NON-REACTI  Cytology - PAP     Status: None   Collection Time: 05/17/18 12:00 AM  Result Value Ref Range   Adequacy      Satisfactory for evaluation  endocervical/transformation zone component PRESENT.   Diagnosis      NEGATIVE FOR INTRAEPITHELIAL LESIONS OR MALIGNANCY.   Chlamydia Negative     Comment: Normal Reference Range - Negative   Neisseria gonorrhea Negative     Comment: Normal Reference Range - Negative   Trichomonas Negative     Comment: Normal Reference Range - Negative   Material Submitted CervicoVaginal Pap [ThinPrep Imaged]    CYTOLOGY - PAP PAP RESULT   POCT Urinalysis Dipstick     Status: Abnormal   Collection Time: 05/17/18 11:52 AM  Result Value Ref Range   Color, UA yellow    Clarity, UA clear    Glucose, UA Negative Negative   Bilirubin, UA neg    Ketones, UA neg    Spec Grav, UA 1.015 1.010 - 1.025   Blood, UA small    pH, UA 6.0 5.0 - 8.0   Protein, UA Negative Negative   Urobilinogen, UA 0.2 0.2 or 1.0 E.U./dL   Nitrite, UA neg    Leukocytes, UA Negative Negative   Appearance normal    Odor normal   Urine Culture     Status: None   Collection Time: 05/17/18  3:53 PM  Result Value Ref Range   Urine Culture, Routine Final report    Organism ID, Bacteria Comment     Comment: Mixed urogenital flora Less than 10,000 colonies/mL   POCT urinalysis dipstick     Status: Abnormal   Collection Time: 07/10/18 12:10 PM  Result Value Ref Range   Color, UA yellow     Comment: Sediment   Glucose, UA Negative Negative   Bilirubin, UA neg    Ketones, UA neg    Spec Grav, UA 1.015 1.010 - 1.025   Blood, UA moderate    pH, UA 5.0 5.0 - 8.0   Protein, UA Negative Negative   Urobilinogen, UA negative (A) 0.2 or 1.0 E.U./dL   Nitrite, UA neg    Leukocytes, UA Negative  Negative   Appearance     Odor    POCT urine pregnancy     Status: Normal   Collection Time: 07/10/18 12:25 PM  Result Value Ref Range   Preg Test, Ur Negative Negative     PHQ2/9: Depression screen [facility] 2/9 07/10/2018 07/07/2018 05/10/2018  Decreased Interest 0 0 0  Down, Depressed, Hopeless 0 0 0  PHQ - 2 Score 0 0 0  Altered sleeping 0 0 0  Tired, decreased energy 0 0 1  Change in appetite 0 0 1  Feeling bad or failure about yourself  0 0 0  Trouble concentrating 0 0 0  Moving slowly or fidgety/restless 0 0 0  Suicidal thoughts 0 0 0  PHQ-9 Score 0 0 2  Difficult doing work/chores Not difficult at all Not difficult at all Not difficult at all    phq 9 is negative   Fall Risk: Fall Risk  07/10/2018 07/07/2018 05/10/2018  Falls in the past year? 0 0 0  Number falls in past yr: 0 0 0  Injury with Fall? 0 0 0      Functional Status Survey: Is the patient deaf or have difficulty hearing?: No Does the patient have difficulty seeing, even when wearing glasses/contacts?: No Does the patient have difficulty concentrating, remembering, or making decisions?: No Does the patient have difficulty walking or climbing stairs?: No Does the patient have difficulty dressing or bathing?: No Does the patient have difficulty doing errands alone such as visiting a doctor's office or shopping?: No    Assessment & Plan  1. Flank pain  - CULTURE, URINE COMPREHENSIVE - POCT urinalysis dipstick - CT RENAL STONE STUDY; Future  2. Family history of kidney stones  - CULTURE, URINE COMPREHENSIVE - POCT urinalysis dipstick - CT RENAL STONE STUDY; Future  3. Encounter for pregnancy test, result unknown  - POCT urine pregnancy  4. Hematuria, unspecified type  - CT RENAL STONE STUDY; Future  5. Right lower quadrant abdominal pain  - CT RENAL STONE STUDY; Future

## 2018-07-11 ENCOUNTER — Ambulatory Visit
Admission: RE | Admit: 2018-07-11 | Discharge: 2018-07-11 | Disposition: A | Discharge status: Home / Self Care | Payer: Commercial Managed Care - PPO | Source: Ambulatory Visit | Attending: Family Medicine | Admitting: Family Medicine

## 2018-07-11 ENCOUNTER — Other Ambulatory Visit: Payer: Self-pay

## 2018-07-11 DIAGNOSIS — R319 Hematuria, unspecified: Secondary | ICD-10-CM | POA: Diagnosis present

## 2018-07-11 DIAGNOSIS — R1031 Right lower quadrant pain: Secondary | ICD-10-CM | POA: Diagnosis present

## 2018-07-11 DIAGNOSIS — R109 Unspecified abdominal pain: Secondary | ICD-10-CM | POA: Insufficient documentation

## 2018-07-11 DIAGNOSIS — Z841 Family history of disorders of kidney and ureter: Secondary | ICD-10-CM | POA: Diagnosis present

## 2018-07-12 LAB — CULTURE, URINE COMPREHENSIVE
MICRO NUMBER:: 425917
RESULT:: NO GROWTH
SPECIMEN QUALITY:: ADEQUATE

## 2018-07-19 ENCOUNTER — Encounter: Payer: Commercial Managed Care - PPO | Admitting: Family Medicine

## 2018-12-08 ENCOUNTER — Other Ambulatory Visit: Payer: Self-pay | Admitting: Internal Medicine

## 2018-12-08 DIAGNOSIS — Z20822 Contact with and (suspected) exposure to covid-19: Secondary | ICD-10-CM

## 2018-12-09 LAB — NOVEL CORONAVIRUS, NAA: SARS-CoV-2, NAA: NOT DETECTED

## 2018-12-15 ENCOUNTER — Other Ambulatory Visit: Payer: Self-pay

## 2018-12-15 DIAGNOSIS — Z20822 Contact with and (suspected) exposure to covid-19: Secondary | ICD-10-CM

## 2018-12-16 LAB — NOVEL CORONAVIRUS, NAA: SARS-CoV-2, NAA: NOT DETECTED

## 2018-12-25 ENCOUNTER — Ambulatory Visit (INDEPENDENT_AMBULATORY_CARE_PROVIDER_SITE_OTHER): Payer: Commercial Managed Care - PPO | Admitting: Family Medicine

## 2018-12-25 ENCOUNTER — Encounter: Payer: Self-pay | Admitting: Family Medicine

## 2018-12-25 ENCOUNTER — Other Ambulatory Visit: Payer: Self-pay

## 2018-12-25 VITALS — Temp 98.4°F

## 2018-12-25 DIAGNOSIS — J069 Acute upper respiratory infection, unspecified: Secondary | ICD-10-CM | POA: Diagnosis not present

## 2018-12-25 DIAGNOSIS — Z20822 Contact with and (suspected) exposure to covid-19: Secondary | ICD-10-CM

## 2018-12-25 DIAGNOSIS — R05 Cough: Secondary | ICD-10-CM

## 2018-12-25 DIAGNOSIS — R059 Cough, unspecified: Secondary | ICD-10-CM

## 2018-12-25 MED ORDER — BENZONATATE 100 MG PO CAPS: 100.0000 mg | ORAL_CAPSULE | Freq: Two times a day (BID) | ORAL | 0 refills | Status: DC | PRN

## 2018-12-25 NOTE — Progress Notes (Signed)
Name: Brittney Robinson   MRN: [id number]    DOB: 1990-08-11   Date:12/25/2018       Progress Note  Subjective  Chief Complaint  Chief Complaint  Patient presents with  . Sore Throat    Onset-3 days, has taken Advil  . Cough    Worst at night, dry cough  . Family history of hypercholesterol    Would like to speak to a dietitian regarding cholesterol problems that run in her family    I connected with  Cabrini Galindo-Palestino  on 12/25/18 at  3:20 PM EDT by a video enabled telemedicine application and verified that I am speaking with the correct person using two identifiers.  I discussed the limitations of evaluation and management by telemedicine and the availability of in person appointments. The patient expressed understanding and agreed to proceed. Staff also discussed with the patient that there may be a patient responsible charge related to this service. Patient Location: at home  Provider Location: [facility]   HPI  Family history of high cholesterol: reviewed her labs done 04/2018, explained her insurance will likely not cover her since her levels were normal, but she can contact them to verify   URI: she works at [employer], she states she was tested on 12/15/2018 because she was exposed. She states this time around she developed sore throat a couple of days ago , now also having congestion, rhinorrhea, and dry cough. She had some body aches yesterday but doing better today. No fever or chills.    Patient Active Problem List   Diagnosis Date Noted  . Atypical mole 05/10/2018  . Chronic anemia 12/01/2016  . Refusal of blood transfusions as patient is Jehovah's Witness 04/22/2016    Past Surgical History:  Procedure Laterality Date  . CHOLECYSTECTOMY  2013    Family History  Problem Relation Age of Onset  . Heart disease Maternal Grandmother   . Heart disease Maternal Grandfather   . Heart disease Paternal Grandmother   . Hypertension  Paternal Grandmother   . Heart disease Paternal Grandfather   . Hypercholesterolemia Mother   . Allergies Father   . Bipolar disorder Sister   . Kidney Stones Sister   . Obesity Maternal Aunt   . Hypertension Maternal Aunt   . Schizophrenia Maternal Aunt   . Obesity Paternal Uncle   . Kidney Stones Sister     Social History   Socioeconomic History  . Marital status: Married    Spouse name: Jose   . Number of children: 2  . Years of education: Not on file  . Highest education level: 12th grade  Occupational History  . Occupation: CNA  Social Needs  . Financial resource strain: Not hard at all  . Food insecurity    Worry: Never true    Inability: Never true  . Transportation needs    Medical: No    Non-medical: No  Tobacco Use  . Smoking status: Never Smoker  . Smokeless tobacco: Never Used  Substance and Sexual Activity  . Alcohol use: No  . Drug use: No  . Sexual activity: Yes    Partners: Male    Birth control/protection: Condom  Lifestyle  . Physical activity    Days per week: 0 days    Minutes per session: 0 min  . Stress: Not at all  Relationships  . Social connections    Talks on phone: More than three times a week    Gets together: More than  three times a week    Attends religious service: More than 4 times per year    Active member of club or organization: Yes    Attends meetings of clubs or organizations: More than 4 times per year    Relationship status: Married  . Intimate partner violence    Fear of current or ex partner: No    Emotionally abused: No    Physically abused: No    Forced sexual activity: No  Other Topics Concern  . Not on file  Social History Narrative   Born in [place], both parents from [place].    Moved to [place] with parents around age 67   She married in [place] also from [place] and they have 2 sons     Current Outpatient Medications:  .  naproxen (NAPROSYN) 500 MG tablet, Take 1 tablet (500 mg total) by mouth 2 (two) times daily  with a meal., Disp: 30 tablet, Rfl: 0 .  nitrofurantoin, macrocrystal-monohydrate, (MACROBID) 100 MG capsule, Take 1 capsule (100 mg total) by mouth 2 (two) times daily., Disp: 10 capsule, Rfl: 0  No Known Allergies  I personally reviewed active problem list, medication list, allergies, family history, social history with the patient/caregiver today.   ROS  Ten systems reviewed and is negative except as mentioned in HPI   Objective  Virtual encounter, vitals not obtained.  There is no height or weight on file to calculate BMI.  Physical Exam  Awake, alert and oriented, sounding congested and dry cough'  PHQ2/9: Depression screen [facility] 2/9 12/25/2018 07/10/2018 07/07/2018 05/10/2018  Decreased Interest 0 0 0 0  Down, Depressed, Hopeless 0 0 0 0  PHQ - 2 Score 0 0 0 0  Altered sleeping 0 0 0 0  Tired, decreased energy 0 0 0 1  Change in appetite 0 0 0 1  Feeling bad or failure about yourself  0 0 0 0  Trouble concentrating 0 0 0 0  Moving slowly or fidgety/restless 0 0 0 0  Suicidal thoughts 0 0 0 0  PHQ-9 Score 0 0 0 2  Difficult doing work/chores Not difficult at all Not difficult at all Not difficult at all Not difficult at all   PHQ-2/9 Result is negative.    Fall Risk: Fall Risk  12/25/2018 07/10/2018 07/07/2018 05/10/2018  Falls in the past year? 0 0 0 0  Number falls in past yr: 0 0 0 0  Injury with Fall? 0 0 0 0     Assessment & Plan  1. URI, acute  Discussed COVID-19 symptoms and red flags, excuse for work for the next week, or longer if covid -19 positive, self quarantine for now.   2. Cough  - benzonatate (TESSALON) 100 MG capsule; Take 1-2 capsules (100-200 mg total) by mouth 2 (two) times daily as needed.  Dispense: 40 capsule; Refill: 0  I discussed the assessment and treatment plan with the patient. The patient was provided an opportunity to ask questions and all were answered. The patient agreed with the plan and demonstrated an understanding of the  instructions.  The patient was advised to call back or seek an in-person evaluation if the symptoms worsen or if the condition fails to improve as anticipated.  I provided 15  minutes of non-face-to-face time during this encounter.

## 2018-12-26 LAB — NOVEL CORONAVIRUS, NAA: SARS-CoV-2, NAA: NOT DETECTED

## 2019-01-05 ENCOUNTER — Other Ambulatory Visit: Payer: Self-pay

## 2019-01-05 DIAGNOSIS — Z20822 Contact with and (suspected) exposure to covid-19: Secondary | ICD-10-CM

## 2019-01-07 LAB — NOVEL CORONAVIRUS, NAA: SARS-CoV-2, NAA: NOT DETECTED

## 2019-01-21 DIAGNOSIS — U071 COVID-19: Secondary | ICD-10-CM

## 2019-01-21 HISTORY — DX: COVID-19: U07.1

## 2019-01-31 ENCOUNTER — Encounter: Payer: Self-pay | Admitting: Family Medicine

## 2019-01-31 ENCOUNTER — Other Ambulatory Visit: Payer: Self-pay

## 2019-01-31 ENCOUNTER — Ambulatory Visit (INDEPENDENT_AMBULATORY_CARE_PROVIDER_SITE_OTHER): Payer: Commercial Managed Care - PPO | Admitting: Family Medicine

## 2019-01-31 VITALS — Temp 97.2°F

## 2019-01-31 DIAGNOSIS — R05 Cough: Secondary | ICD-10-CM

## 2019-01-31 DIAGNOSIS — R059 Cough, unspecified: Secondary | ICD-10-CM

## 2019-01-31 DIAGNOSIS — U071 COVID-19: Secondary | ICD-10-CM | POA: Diagnosis not present

## 2019-01-31 MED ORDER — METHYLPREDNISOLONE 4 MG PO TBPK: ORAL_TABLET | ORAL | 0 refills | Status: DC

## 2019-01-31 NOTE — Progress Notes (Signed)
Name: Brittney Robinson   MRN: [id number]    DOB: 03-Dec-1990   Date:01/31/2019       Progress Note  Subjective  Chief Complaint  Chief Complaint  Patient presents with  . Cough    Dry Cough- [facility] gave her hydrocodone cough syrup and has not helped at all. Has been taking otc Honey Nyquil and Dayquil  . COVID-19 Positive    Symptoms first started on 01/17/2019- Tested Positive on 01/21/2019 at [facility]  . Fever    100.3 yesterday     I connected with  Ladoris Galindo-Palestino  on 01/31/19 at  2:40 PM EST by a video enabled telemedicine application and verified that I am speaking with the correct person using two identifiers.  I discussed the limitations of evaluation and management by telemedicine and the availability of in person appointments. The patient expressed understanding and agreed to proceed. Staff also discussed with the patient that there may be a patient responsible charge related to this service. Patient Location: at home Provider Location: [facility]   HPI  COVID-19 symptoms: she developed symptoms on 01/17/2019 described as fatigue, headache, followed by a dry cough and fever. She went to Urgent Care on 01/21/2019 and tested positive on 01/22/2019. She is taking otc medication ( Tylenol, Advil, Nyquil, Dayquil) , she states the cough syrup given by Urgent Care did not work just causing her to feel sad. Tessalon perles does not seem to work either. She had some wheezing and had one episode of SOB but is feeling better now. Discussed Breo or Symbicort to help with symptoms or dexamethasone, and she chose the later.   Patient Active Problem List   Diagnosis Date Noted  . Atypical mole 05/10/2018  . Chronic anemia 12/01/2016  . Refusal of blood transfusions as patient is Jehovah's Witness 04/22/2016    Social History   Tobacco Use  . Smoking status: Never Smoker  . Smokeless tobacco: Never Used  Substance Use Topics  .  Alcohol use: No     Current Outpatient Medications:  .  acetaminophen (TYLENOL) 325 MG tablet, Take 650 mg by mouth every 6 (six) hours as needed., Disp: , Rfl:  .  benzonatate (TESSALON) 100 MG capsule, Take 1-2 capsules (100-200 mg total) by mouth 2 (two) times daily as needed., Disp: 40 capsule, Rfl: 0 .  HYDROcodone-homatropine (HYCODAN) 5-1.5 MG/5ML syrup, Take by mouth., Disp: , Rfl:  .  naproxen (NAPROSYN) 500 MG tablet, Take 1 tablet (500 mg total) by mouth 2 (two) times daily with a meal. (Patient not taking: Reported on 01/31/2019), Disp: 30 tablet, Rfl: 0 .  nitrofurantoin, macrocrystal-monohydrate, (MACROBID) 100 MG capsule, Take 1 capsule (100 mg total) by mouth 2 (two) times daily. (Patient not taking: Reported on 01/31/2019), Disp: 10 capsule, Rfl: 0  No Known Allergies  I personally reviewed active problem list, medication list, allergies, family history, social history, health maintenance with the patient/caregiver today.  ROS  Ten systems reviewed and is negative except as mentioned in HPI   Objective  Virtual encounter, vitals not obtained.  There is no height or weight on file to calculate BMI.  Nursing Note and Vital Signs reviewed.  Physical Exam  Awake, alert and oriented   Assessment & Plan  1. COVID-19 virus infection  Discussed possible side effects of medrol dosepack, importance of taking with food, she can also take vitamin D, vitamin C, lots of fluids and rest to recover. Call back if worsening of symptoms or  call 911.  - methylPREDNISolone (MEDROL DOSEPAK) 4 MG TBPK tablet; Take as directed  Dispense: 21 tablet; Refill: 0 - MyChart COVID-19 home monitoring program; Future - Temperature monitoring; Future  2. Cough  - methylPREDNISolone (MEDROL DOSEPAK) 4 MG TBPK tablet; Take as directed  Dispense: 21 tablet; Refill: 0 - MyChart COVID-19 home monitoring program; Future - Temperature monitoring; Future  -Red flags and when to present for  emergency care or RTC including fever >101.55F, chest pain, shortness of breath, new/worsening/un-resolving symptoms,  reviewed with patient at time of visit. Follow up and care instructions discussed and provided in AVS. - I discussed the assessment and treatment plan with the patient. The patient was provided an opportunity to ask questions and all were answered. The patient agreed with the plan and demonstrated an understanding of the instructions.  I provided 15 minutes of non-face-to-face time during this encounter.  Loistine Chance, MD

## 2019-02-12 ENCOUNTER — Other Ambulatory Visit: Payer: Self-pay | Admitting: Family Medicine

## 2019-02-12 DIAGNOSIS — R059 Cough, unspecified: Secondary | ICD-10-CM

## 2019-02-12 DIAGNOSIS — U071 COVID-19: Secondary | ICD-10-CM

## 2019-02-12 DIAGNOSIS — R05 Cough: Secondary | ICD-10-CM

## 2019-03-06 ENCOUNTER — Encounter: Payer: Self-pay | Admitting: Family Medicine

## 2019-03-06 ENCOUNTER — Other Ambulatory Visit: Payer: Self-pay

## 2019-03-06 ENCOUNTER — Ambulatory Visit: Payer: Commercial Managed Care - PPO | Admitting: Family Medicine

## 2019-03-06 VITALS — BP 114/77 | HR 84 | Temp 97.3°F | Resp 16 | Ht 58.5 in | Wt 134.7 lb

## 2019-03-06 DIAGNOSIS — Z23 Encounter for immunization: Secondary | ICD-10-CM | POA: Diagnosis not present

## 2019-03-06 DIAGNOSIS — M542 Cervicalgia: Secondary | ICD-10-CM | POA: Diagnosis not present

## 2019-03-06 DIAGNOSIS — Z8619 Personal history of other infectious and parasitic diseases: Secondary | ICD-10-CM | POA: Diagnosis not present

## 2019-03-06 DIAGNOSIS — J45990 Exercise induced bronchospasm: Secondary | ICD-10-CM | POA: Diagnosis not present

## 2019-03-06 DIAGNOSIS — Z8616 Personal history of COVID-19: Secondary | ICD-10-CM

## 2019-03-06 MED ORDER — ALBUTEROL SULFATE HFA 108 (90 BASE) MCG/ACT IN AERS: 2.0000 | INHALATION_SPRAY | Freq: Four times a day (QID) | RESPIRATORY_TRACT | 0 refills | Status: DC | PRN

## 2019-03-06 MED ORDER — BACLOFEN 10 MG PO TABS: 10.0000 mg | ORAL_TABLET | Freq: Every evening | ORAL | 0 refills | Status: DC | PRN

## 2019-03-06 MED ORDER — MELOXICAM 15 MG PO TABS: 15.0000 mg | ORAL_TABLET | Freq: Every day | ORAL | 0 refills | Status: DC

## 2019-03-06 NOTE — Progress Notes (Signed)
Name: Brittney Robinson   MRN: [id number]    DOB: 01/25/1991   Date:03/06/2019       Progress Note  Subjective  Chief Complaint  Chief Complaint  Patient presents with  . 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 Advised to ask for a step stools to be placed on her computer stating or to be able to have a desk with a computer.She states symptoms are not present when off work, pain is worse at the end of her shift. Pain is described as aching, she has been taking Advil and Tylenol, she is thinking about seeing chiropractor. Discussed work station modification, nsaids for short term, stop otc medication and muscle relaxer at night.   History of COVID-19, diagnosed 01/21/2019, her symptoms with fatigue, followed by headache, cough , fever with high temperature, some vomiting,  symptoms lasted 11 days, she has been back to work, however unable to exercise because it has caused some chest tightness but no wheezing, she also has some SOB with activity. Discussed gradually increase activity and start with walking and we will try ventolin to use prn   History of anemia: no pica, she has some sob with activity but only since COVID, her periods are short and not heavy. Discussed getting labs today but she prefers holding off until CPE   Patient Active Problem List   Diagnosis Date Noted  . Atypical mole 05/10/2018  . Chronic anemia 12/01/2016  . Refusal of blood transfusions as patient is Jehovah's Witness 04/22/2016    Past Surgical History:  Procedure Laterality Date  . CHOLECYSTECTOMY  2013    Family History  Problem Relation Age of Onset  . Heart disease Maternal Grandmother   . Heart disease Maternal Grandfather   . Heart disease Paternal Grandmother   . Hypertension Paternal Grandmother   . Heart disease Paternal  Grandfather   . Hypercholesterolemia Mother   . Allergies Father   . Bipolar disorder Sister   . Kidney Stones Sister   . Obesity Maternal Aunt   . Hypertension Maternal Aunt   . Schizophrenia Maternal Aunt   . Obesity Paternal Uncle   . Kidney Stones Sister     Social History   Socioeconomic History  . Marital status: Married    Spouse name: Jose   . Number of children: 2  . Years of education: Not on file  . Highest education level: 12th grade  Occupational History  . Occupation: CNA  Tobacco Use  . Smoking status: Never Smoker  . Smokeless tobacco: Never Used  Substance and Sexual Activity  . Alcohol use: No  . Drug use: No  . Sexual activity: Yes    Partners: Male    Birth control/protection: Condom  Other Topics Concern  . Not on file  Social History Narrative   Born in [place], both parents from [place].    Moved to [place] with parents around age 74   She married in [place] also from [place] and they have 2 sons   Social Determinants of Health   Financial Resource Strain: Low Risk   . Difficulty of Paying Living Expenses: Not hard at all  Food Insecurity: No Food Insecurity  . Worried About Charity fundraiser in the Last Year: Never true  . Ran Out of  Food in the Last Year: Never true  Transportation Needs: No Transportation Needs  . Lack of Transportation (Medical): No  . Lack of Transportation (Non-Medical): No  Physical Activity: Inactive  . Days of Exercise per Week: 0 days  . Minutes of Exercise per Session: 0 min  Stress: No Stress Concern Present  . Feeling of Stress : Not at all  Social Connections: Not Isolated  . Frequency of Communication with Friends and Family: More than three times a week  . Frequency of Social Gatherings with Friends and Family: More than three times a week  . Attends Religious Services: More than 4 times per year  . Active Member of Clubs or Organizations: Yes  . Attends Archivist Meetings: More than 4 times per year   . Marital Status: Married  Human resources officer Violence: Not At Risk  . Fear of Current or Ex-Partner: No  . Emotionally Abused: No  . Physically Abused: No  . Sexually Abused: No     Current Outpatient Medications:  .  acetaminophen (TYLENOL) 325 MG tablet, Take 650 mg by mouth every 6 (six) hours as needed., Disp: , Rfl:  .  benzonatate (TESSALON) 100 MG capsule, Take 1-2 capsules (100-200 mg total) by mouth 2 (two) times daily as needed. (Patient not taking: Reported on 03/06/2019), Disp: 40 capsule, Rfl: 0 .  methylPREDNISolone (MEDROL DOSEPAK) 4 MG TBPK tablet, Take as directed (Patient not taking: Reported on 03/06/2019), Disp: 21 tablet, Rfl: 0  No Known Allergies  I personally reviewed active problem list, medication list, allergies, family history, social history, health maintenance with the patient/caregiver today.   ROS  Constitutional: Negative for fever or weight change.  Respiratory: Negative for cough and shortness of breath.   Cardiovascular: Negative for chest pain or palpitations.  Gastrointestinal: Negative for abdominal pain, no bowel changes.  Musculoskeletal: Negative for gait problem or joint swelling.  Skin: Negative for rash.  Neurological: Negative for dizziness or headache.  No other specific complaints in a complete review of systems (except as listed in HPI above).   Objective  Vitals:   03/06/19 1150  BP: 114/77  Pulse: 84  Resp: 16  Temp: (!) 97.3 F (36.3 C)  TempSrc: Temporal  SpO2: 99%  Weight: 134 lb 11.2 oz (61.1 kg)  Height: 4' 10.5" (1.486 m)    Body mass index is 27.67 kg/m.  Physical Exam  Constitutional: Patient appears well-developed and well-nourished. No distress.  HEENT: head atraumatic, normocephalic, pupils equal and reactive to light Cardiovascular: Normal rate, regular rhythm and normal heart sounds.  No murmur heard. No BLE edema. Pulmonary/Chest: Effort normal and breath sounds normal. No respiratory  distress. Abdominal: Soft.  There is no tenderness. Psychiatric: Patient has a normal mood and affect. behavior is normal. Judgment and thought content normal.  Recent Results (from the past 2160 hour(s))  Novel Coronavirus, NAA ([facility])     Status: None   Collection Time: 12/08/18 12:00 AM   Specimen: Oropharyngeal(OP) collection in vial transport medium   OROPHARYNGEA  TESTING  Result Value Ref Range   SARS-CoV-2, NAA Not Detected Not Detected    Comment: This nucleic acid amplification test was developed and its performance characteristics determined by [employer]. Nucleic acid amplification tests include PCR and TMA. This test has not been FDA cleared or approved. This test has been authorized by FDA under an Emergency Use Authorization (EUA). This test is only authorized for the duration of time the declaration that circumstances exist justifying the  authorization of the emergency use of in vitro diagnostic tests for detection of SARS-CoV-2 virus and/or diagnosis of COVID-19 infection under section 564(b)(1) of the Act, 21 U.S.C. [id number]) (1), unless the authorization is terminated or revoked sooner. When diagnostic testing is negative, the possibility of a false negative result should be considered in the context of a patient's recent exposures and the presence of clinical signs and symptoms consistent with COVID-19. An individual without symptoms of COVID-19 and who is not shedding SARS-CoV-2 virus would  expect to have a negative (not detected) result in this assay.   Novel Coronavirus, NAA ([facility])     Status: None   Collection Time: 12/15/18 12:12 PM   Specimen: Nasopharyngeal(NP) swabs in vial transport medium   NASOPHARYNGE  TESTING  Result Value Ref Range   SARS-CoV-2, NAA Not Detected Not Detected    Comment: This nucleic acid amplification test was developed and its performance characteristics determined by [employer]. Nucleic  acid amplification tests include PCR and TMA. This test has not been FDA cleared or approved. This test has been authorized by FDA under an Emergency Use Authorization (EUA). This test is only authorized for the duration of time the declaration that circumstances exist justifying the authorization of the emergency use of in vitro diagnostic tests for detection of SARS-CoV-2 virus and/or diagnosis of COVID-19 infection under section 564(b)(1) of the Act, 21 U.S.C. [id number]) (1), unless the authorization is terminated or revoked sooner. When diagnostic testing is negative, the possibility of a false negative result should be considered in the context of a patient's recent exposures and the presence of clinical signs and symptoms consistent with COVID-19. An individual without symptoms of COVID-19 and who is not shedding SARS-CoV-2 virus would  expect to have a negative (not detected) result in this assay.   Novel Coronavirus, NAA ([facility])     Status: None   Collection Time: 12/25/18 12:00 AM   Specimen: Oropharyngeal(OP) collection in vial transport medium   OROPHARYNGEA  TESTING  Result Value Ref Range   SARS-CoV-2, NAA Not Detected Not Detected    Comment: This nucleic acid amplification test was developed and its performance characteristics determined by [employer]. Nucleic acid amplification tests include PCR and TMA. This test has not been FDA cleared or approved. This test has been authorized by FDA under an Emergency Use Authorization (EUA). This test is only authorized for the duration of time the declaration that circumstances exist justifying the authorization of the emergency use of in vitro diagnostic tests for detection of SARS-CoV-2 virus and/or diagnosis of COVID-19 infection under section 564(b)(1) of the Act, 21 U.S.C. [id number]) (1), unless the authorization is terminated or revoked sooner. When diagnostic testing is negative, the possibility of a  false negative result should be considered in the context of a patient's recent exposures and the presence of clinical signs and symptoms consistent with COVID-19. An individual without symptoms of COVID-19 and who is not shedding SARS-CoV-2 virus would  expect to have a negative (not detected) result in this assay.   Novel Coronavirus, NAA ([facility])     Status: None   Collection Time: 01/05/19 12:00 AM   Specimen: Nasopharyngeal(NP) swabs in vial transport medium   NASOPHARYNGE  TESTING  Result Value Ref Range   SARS-CoV-2, NAA Not Detected Not Detected    Comment: This nucleic acid amplification test was developed and its performance characteristics determined by [employer]. Nucleic acid amplification tests include PCR and TMA. This test has not been FDA cleared  or approved. This test has been authorized by FDA under an Emergency Use Authorization (EUA). This test is only authorized for the duration of time the declaration that circumstances exist justifying the authorization of the emergency use of in vitro diagnostic tests for detection of SARS-CoV-2 virus and/or diagnosis of COVID-19 infection under section 564(b)(1) of the Act, 21 U.S.C. [id number]) (1), unless the authorization is terminated or revoked sooner. When diagnostic testing is negative, the possibility of a false negative result should be considered in the context of a patient's recent exposures and the presence of clinical signs and symptoms consistent with COVID-19. An individual without symptoms of COVID-19 and who is not shedding SARS-CoV-2 virus would  expect to have a negative (not detected) result in this assay.       PHQ2/9: Depression screen [facility] 2/9 03/06/2019 01/31/2019 12/25/2018 07/10/2018 07/07/2018  Decreased Interest 0 0 0 0 0  Down, Depressed, Hopeless 0 1 0 0 0  PHQ - 2 Score 0 1 0 0 0  Altered sleeping 0 0 0 0 0  Tired, decreased energy 0 0 0 0 0  Change in appetite 0 0 0 0 0   Feeling bad or failure about yourself  0 0 0 0 0  Trouble concentrating 0 0 0 0 0  Moving slowly or fidgety/restless 0 0 0 0 0  Suicidal thoughts 0 0 0 0 0  PHQ-9 Score 0 1 0 0 0  Difficult doing work/chores - - Not difficult at all Not difficult at all Not difficult at all    phq 9 is negative   Fall Risk: Fall Risk  03/06/2019 01/31/2019 12/25/2018 07/10/2018 07/07/2018  Falls in the past year? 0 0 0 0 0  Number falls in past yr: 0 0 0 0 0  Injury with Fall? 0 0 0 0 0     Functional Status Survey: Is the patient deaf or have difficulty hearing?: No Does the patient have difficulty seeing, even when wearing glasses/contacts?: No Does the patient have difficulty concentrating, remembering, or making decisions?: No Does the patient have difficulty walking or climbing stairs?: No Does the patient have difficulty dressing or bathing?: No Does the patient have difficulty doing errands alone such as visiting a doctor's office or shopping?: No   Assessment & Plan  1. History of 2019 novel coronavirus disease (COVID-19)   2. Need for immunization against influenza  - Flu Vaccine QUAD 36+ mos IM  3. Exercise-induced RAD (reactive airway disease)  - albuterol (VENTOLIN HFA) 108 (90 Base) MCG/ACT inhaler; Inhale 2 puffs into the lungs every 6 (six) hours as needed for wheezing or shortness of breath.  Dispense: 18 g; Refill: 0  4. Neck pain on right side  - baclofen (LIORESAL) 10 MG tablet; Take 1 tablet (10 mg total) by mouth at bedtime as needed for muscle spasms.  Dispense: 30 each; Refill: 0 - meloxicam (MOBIC) 15 MG tablet; Take 1 tablet (15 mg total) by mouth daily.  Dispense: 30 tablet; Refill: 0

## 2019-03-30 ENCOUNTER — Other Ambulatory Visit: Payer: Self-pay | Admitting: Family Medicine

## 2019-03-30 DIAGNOSIS — M542 Cervicalgia: Secondary | ICD-10-CM

## 2019-04-13 ENCOUNTER — Other Ambulatory Visit: Payer: Self-pay | Admitting: Family Medicine

## 2019-04-13 DIAGNOSIS — M542 Cervicalgia: Secondary | ICD-10-CM

## 2019-04-27 ENCOUNTER — Other Ambulatory Visit: Payer: Self-pay | Admitting: Family Medicine

## 2019-04-27 DIAGNOSIS — M542 Cervicalgia: Secondary | ICD-10-CM

## 2019-04-27 NOTE — Telephone Encounter (Signed)
Pharmacy requesting 90 day supply of baclofen. With one refill. Last refilled on 04/14/19 for 30 tablets

## 2019-05-07 ENCOUNTER — Other Ambulatory Visit (HOSPITAL_COMMUNITY)
Admission: RE | Admit: 2019-05-07 | Discharge: 2019-05-07 | Disposition: A | Discharge status: Home / Self Care | Payer: Commercial Managed Care - PPO | Source: Ambulatory Visit | Attending: Family Medicine | Admitting: Family Medicine

## 2019-05-07 ENCOUNTER — Encounter: Payer: Self-pay | Admitting: Family Medicine

## 2019-05-07 ENCOUNTER — Other Ambulatory Visit: Payer: Self-pay

## 2019-05-07 ENCOUNTER — Ambulatory Visit (INDEPENDENT_AMBULATORY_CARE_PROVIDER_SITE_OTHER): Payer: Commercial Managed Care - PPO | Admitting: Family Medicine

## 2019-05-07 VITALS — BP 120/76 | HR 92 | Temp 97.3°F | Resp 14 | Ht 59.0 in | Wt 133.6 lb

## 2019-05-07 DIAGNOSIS — Z13220 Encounter for screening for lipoid disorders: Secondary | ICD-10-CM

## 2019-05-07 DIAGNOSIS — Z113 Encounter for screening for infections with a predominantly sexual mode of transmission: Secondary | ICD-10-CM | POA: Insufficient documentation

## 2019-05-07 DIAGNOSIS — Z0000 Encounter for general adult medical examination without abnormal findings: Secondary | ICD-10-CM

## 2019-05-07 DIAGNOSIS — R1013 Epigastric pain: Secondary | ICD-10-CM

## 2019-05-07 DIAGNOSIS — R140 Abdominal distension (gaseous): Secondary | ICD-10-CM

## 2019-05-07 DIAGNOSIS — R1110 Vomiting, unspecified: Secondary | ICD-10-CM

## 2019-05-07 DIAGNOSIS — Z131 Encounter for screening for diabetes mellitus: Secondary | ICD-10-CM

## 2019-05-07 DIAGNOSIS — R198 Other specified symptoms and signs involving the digestive system and abdomen: Secondary | ICD-10-CM

## 2019-05-07 DIAGNOSIS — R194 Change in bowel habit: Secondary | ICD-10-CM

## 2019-05-07 DIAGNOSIS — Z3009 Encounter for other general counseling and advice on contraception: Secondary | ICD-10-CM

## 2019-05-07 DIAGNOSIS — R1084 Generalized abdominal pain: Secondary | ICD-10-CM

## 2019-05-07 NOTE — Patient Instructions (Signed)
Preventive Care 21-29 Years Old, Female Preventive care refers to visits with your health care provider and lifestyle choices that can promote health and wellness. This includes:  A yearly physical exam. This may also be called an annual well check.  Regular dental visits and eye exams.  Immunizations.  Screening for certain conditions.  Healthy lifestyle choices, such as eating a healthy diet, getting regular exercise, not using drugs or products that contain nicotine and tobacco, and limiting alcohol use. What can I expect for my preventive care visit? Physical exam Your health care provider will check your:  Height and weight. This may be used to calculate body mass index (BMI), which tells if you are at a healthy weight.  Heart rate and blood pressure.  Skin for abnormal spots. Counseling Your health care provider may ask you questions about your:  Alcohol, tobacco, and drug use.  Emotional well-being.  Home and relationship well-being.  Sexual activity.  Eating habits.  Work and work environment.  Method of birth control.  Menstrual cycle.  Pregnancy history. What immunizations do I need?  Influenza (flu) vaccine  This is recommended every year. Tetanus, diphtheria, and pertussis (Tdap) vaccine  You may need a Td booster every 10 years. Varicella (chickenpox) vaccine  You may need this if you have not been vaccinated. Human papillomavirus (HPV) vaccine  If recommended by your health care provider, you may need three doses over 6 months. Measles, mumps, and rubella (MMR) vaccine  You may need at least one dose of MMR. You may also need a second dose. Meningococcal conjugate (MenACWY) vaccine  One dose is recommended if you are age 19-21 years and a first-year college student living in a residence hall, or if you have one of several medical conditions. You may also need additional booster doses. Pneumococcal conjugate (PCV13) vaccine  You may need  this if you have certain conditions and were not previously vaccinated. Pneumococcal polysaccharide (PPSV23) vaccine  You may need one or two doses if you smoke cigarettes or if you have certain conditions. Hepatitis A vaccine  You may need this if you have certain conditions or if you travel or work in places where you may be exposed to hepatitis A. Hepatitis B vaccine  You may need this if you have certain conditions or if you travel or work in places where you may be exposed to hepatitis B. Haemophilus influenzae type b (Hib) vaccine  You may need this if you have certain conditions. You may receive vaccines as individual doses or as more than one vaccine together in one shot (combination vaccines). Talk with your health care provider about the risks and benefits of combination vaccines. What tests do I need?  Blood tests  Lipid and cholesterol levels. These may be checked every 5 years starting at age 20.  Hepatitis C test.  Hepatitis B test. Screening  Diabetes screening. This is done by checking your blood sugar (glucose) after you have not eaten for a while (fasting).  Sexually transmitted disease (STD) testing.  BRCA-related cancer screening. This may be done if you have a family history of breast, ovarian, tubal, or peritoneal cancers.  Pelvic exam and Pap test. This may be done every 3 years starting at age 21. Starting at age 30, this may be done every 5 years if you have a Pap test in combination with an HPV test. Talk with your health care provider about your test results, treatment options, and if necessary, the need for more tests.   Follow these instructions at home: Eating and drinking   Eat a diet that includes fresh fruits and vegetables, whole grains, lean protein, and low-fat dairy.  Take vitamin and mineral supplements as recommended by your health care provider.  Do not drink alcohol if: ? Your health care provider tells you not to drink. ? You are  pregnant, may be pregnant, or are planning to become pregnant.  If you drink alcohol: ? Limit how much you have to 0-1 drink a day. ? Be aware of how much alcohol is in your drink. In the [place] one drink equals one 12 oz bottle of beer (355 mL), one 5 oz glass of wine (148 mL), or one 1 oz glass of hard liquor (44 mL). Lifestyle  Take daily care of your teeth and gums.  Stay active. Exercise for at least 30 minutes on 5 or more days each week.  Do not use any products that contain nicotine or tobacco, such as cigarettes, e-cigarettes, and chewing tobacco. If you need help quitting, ask your health care provider.  If you are sexually active, practice safe sex. Use a condom or other form of birth control (contraception) in order to prevent pregnancy and STIs (sexually transmitted infections). If you plan to become pregnant, see your health care provider for a preconception visit. What's next?  Visit your health care provider once a year for a well check visit.  Ask your health care provider how often you should have your eyes and teeth checked.  Stay up to date on all vaccines. This information is not intended to replace advice given to you by your health care provider. Make sure you discuss any questions you have with your health care provider. Document Revised: 11/10/2017 Document Reviewed: 11/10/2017 Elsevier Patient Education  2020 [employer].

## 2019-05-07 NOTE — Progress Notes (Signed)
Name: Brittney Robinson   MRN: [id number]    DOB: 1991/02/19   Date:05/07/2019       Progress Note  Subjective  Chief Complaint  Chief Complaint  Patient presents with  . Annual Exam    HPI  Patient presents for annual CPE and also to discuss abdominal problems  Bloating: symptoms started months ago ( but worse since COVID-19 infection in Dec). She states symptoms started with epigastric pain/burning and stabbing sometimes, but the pain now happens after meals and feels the burning going down her stomach and through the anal canal during a bowel movement. She has noticed bloating, increase in flatulence , a couple of bowel movements with blood after she strained. She used to have bowel movements 4 times a day and smooth, but now it is hard and bowel movements down to a few times a week. She also has episodes of vomiting when pain is intense. No family history of colitis or cancer as far as she knows. No family history of ovarian cancer. LMP this past Friday, lighter than usual, mostly spotting now she denies cramps. History of cholecystectomy   Diet: balanced meals Exercise: no currently   USPSTF grade A and B recommendations    Office Visit from 05/07/2019 in [facility]  AUDIT-C Score  0     Depression: Phq 9 is  negative Depression screen [facility] 2/9 05/07/2019 03/06/2019 01/31/2019 12/25/2018 07/10/2018  Decreased Interest 0 0 0 0 0  Down, Depressed, Hopeless 0 0 1 0 0  PHQ - 2 Score 0 0 1 0 0  Altered sleeping 0 0 0 0 0  Tired, decreased energy 0 0 0 0 0  Change in appetite 0 0 0 0 0  Feeling bad or failure about yourself  0 0 0 0 0  Trouble concentrating 0 0 0 0 0  Moving slowly or fidgety/restless 0 0 0 0 0  Suicidal thoughts 0 0 0 0 0  PHQ-9 Score 0 0 1 0 0  Difficult doing work/chores Not difficult at all - - Not difficult at all Not difficult at all   Hypertension: BP Readings from Last 3 Encounters:  05/07/19 120/76  03/06/19 114/77   07/10/18 106/70   Obesity: Wt Readings from Last 3 Encounters:  05/07/19 133 lb 9.6 oz (60.6 kg)  03/06/19 134 lb 11.2 oz (61.1 kg)  07/10/18 143 lb (64.9 kg)   BMI Readings from Last 3 Encounters:  05/07/19 26.98 kg/m  03/06/19 27.67 kg/m  07/10/18 29.38 kg/m     Hep C Screening: 04/2018 STD testing and prevention (HIV/chl/gon/syphilis): today  Intimate partner violence: negative screen  Sexual History (Partners/Practices/Protection from [employer] hx STI/Pregnancy Plans): regular cycles, lighter than usual, using condoms but interested in the IUD Pain during Intercourse: occasionally  Menstrual History/LMP/Abnormal Bleeding:  Regular cycles Incontinence Symptoms: none   Breast cancer:  - BRCA gene screening: N/A  Osteoporosis: Discussed high calcium and vitamin D supplementation, weight bearing exercises  Cervical cancer screening: up to date  Skin cancer: Discussed monitoring for atypical lesions    Advanced Care Planning: A voluntary discussion about advance care planning including the explanation and discussion of advance directives.  Discussed health care proxy and Living will, and the patient was able to identify a health care proxy as husband   Patient does not have a living will at present time. If patient does have living will, I have requested they bring this to the clinic to be scanned in to their chart.  Lipids: Lab Results  Component Value Date   CHOL 144 05/10/2018   Lab Results  Component Value Date   HDL 59 05/10/2018   Lab Results  Component Value Date   LDLCALC 65 05/10/2018   Lab Results  Component Value Date   TRIG 116 05/10/2018   Lab Results  Component Value Date   CHOLHDL 2.4 05/10/2018   No results found for: LDLDIRECT  Glucose: Glucose  Date Value Ref Range Status  06/22/2014 103 (H) mg/dL Final    Comment:    65-99 NOTE: New Reference Range  05/21/14    Glucose, Bld  Date Value Ref Range Status  05/10/2018 81 65 - 99  mg/dL Final    Comment:    .            Fasting reference interval .     Patient Active Problem List   Diagnosis Date Noted  . Atypical mole 05/10/2018  . Chronic anemia 12/01/2016  . Refusal of blood transfusions as patient is Jehovah's Witness 04/22/2016    Past Surgical History:  Procedure Laterality Date  . CHOLECYSTECTOMY  2013    Family History  Problem Relation Age of Onset  . Heart disease Maternal Grandmother   . Heart disease Maternal Grandfather   . Heart disease Paternal Grandmother   . Hypertension Paternal Grandmother   . Heart disease Paternal Grandfather   . Hypercholesterolemia Mother   . Allergies Father   . Bipolar disorder Sister   . Kidney Stones Sister   . Obesity Maternal Aunt   . Hypertension Maternal Aunt   . Schizophrenia Maternal Aunt   . Obesity Paternal Uncle   . Kidney Stones Sister     Social History   Socioeconomic History  . Marital status: Married    Spouse name: Jose   . Number of children: 2  . Years of education: Not on file  . Highest education level: 12th grade  Occupational History  . Occupation: CNA  Tobacco Use  . Smoking status: Never Smoker  . Smokeless tobacco: Never Used  Substance and Sexual Activity  . Alcohol use: No  . Drug use: No  . Sexual activity: Yes    Partners: Male    Birth control/protection: Condom  Other Topics Concern  . Not on file  Social History Narrative   Born in [place], both parents from [place].    Moved to [place] with parents around age 30   She married in [place] also from [place] and they have 2 sons   Social Determinants of Health   Financial Resource Strain: Low Risk   . Difficulty of Paying Living Expenses: Not hard at all  Food Insecurity: No Food Insecurity  . Worried About Charity fundraiser in the Last Year: Never true  . Ran Out of Food in the Last Year: Never true  Transportation Needs: No Transportation Needs  . Lack of Transportation (Medical): No  . Lack of  Transportation (Non-Medical): No  Physical Activity: Inactive  . Days of Exercise per Week: 0 days  . Minutes of Exercise per Session: 0 min  Stress: No Stress Concern Present  . Feeling of Stress : Not at all  Social Connections: Not Isolated  . Frequency of Communication with Friends and Family: More than three times a week  . Frequency of Social Gatherings with Friends and Family: More than three times a week  . Attends Religious Services: More than 4 times per year  . Active Member of Clubs  or Organizations: Yes  . Attends Archivist Meetings: More than 4 times per year  . Marital Status: Married  Human resources officer Violence: Not At Risk  . Fear of Current or Ex-Partner: No  . Emotionally Abused: No  . Physically Abused: No  . Sexually Abused: No     Current Outpatient Medications:  .  baclofen (LIORESAL) 10 MG tablet, TAKE 1 TABLET BY MOUTH AT BEDTIME AS NEEDED FOR MUSCLE SPASMS, Disp: 30 tablet, Rfl: 0 .  acetaminophen (TYLENOL) 325 MG tablet, Take 650 mg by mouth every 6 (six) hours as needed., Disp: , Rfl:  .  albuterol (VENTOLIN HFA) 108 (90 Base) MCG/ACT inhaler, Inhale 2 puffs into the lungs every 6 (six) hours as needed for wheezing or shortness of breath., Disp: 18 g, Rfl: 0 .  benzonatate (TESSALON) 100 MG capsule, Take 1-2 capsules (100-200 mg total) by mouth 2 (two) times daily as needed. (Patient not taking: Reported on 03/06/2019), Disp: 40 capsule, Rfl: 0 .  meloxicam (MOBIC) 15 MG tablet, Take 1 tablet (15 mg total) by mouth daily., Disp: 30 tablet, Rfl: 0 .  methylPREDNISolone (MEDROL DOSEPAK) 4 MG TBPK tablet, Take as directed (Patient not taking: Reported on 03/06/2019), Disp: 21 tablet, Rfl: 0  No Known Allergies   ROS  Constitutional: Negative for fever or weight change.  Respiratory: Negative for cough and shortness of breath.   Cardiovascular: Negative for chest pain or palpitations.  Gastrointestinal: Positive for abdominal pain, no bowel  changes.  Musculoskeletal: Negative for gait problem or joint swelling.  Skin: Negative for rash.  Neurological: Negative for dizziness or headache.  No other specific complaints in a complete review of systems (except as listed in HPI above).  Objective  Vitals:   05/07/19 1045  BP: 120/76  Pulse: 92  Resp: 14  Temp: (!) 97.3 F (36.3 C)  TempSrc: Temporal  SpO2: 96%  Weight: 133 lb 9.6 oz (60.6 kg)  Height: '4\' 11"'  (1.499 m)    Body mass index is 26.98 kg/m.  Physical Exam  Constitutional: Patient appears well-developed and well-nourished. No distress.  HENT: Head: Normocephalic and atraumatic. Ears: B TMs ok, no erythema or effusion; Nose: not done Mouth/Throat:not done Eyes: Conjunctivae and EOM are normal. Pupils are equal, round, and reactive to light. No scleral icterus.  Neck: Normal range of motion. Neck supple. No JVD present. No thyromegaly present.  Cardiovascular: Normal rate, regular rhythm and normal heart sounds.  No murmur heard. No BLE edema. Pulmonary/Chest: Effort normal and breath sounds normal. No respiratory distress. Abdominal: Soft. Bowel sounds are normal, no distension. There is no tenderness. no masses Breast: no lumps or masses, no nipple discharge or rashes FEMALE GENITALIA:  Not done RECTAL: not done  Musculoskeletal: Normal range of motion, no joint effusions. No gross deformities Neurological: he is alert and oriented to person, place, and time. No cranial nerve deficit. Coordination, balance, strength, speech and gait are normal.  Skin: Skin is warm and dry. No rash noted. No erythema.  Psychiatric: Patient has a normal mood and affect. behavior is normal. Judgment and thought content normal.  Fall Risk: Fall Risk  05/07/2019 03/06/2019 01/31/2019 12/25/2018 07/10/2018  Falls in the past year? 0 0 0 0 0  Number falls in past yr: 0 0 0 0 0  Injury with Fall? 0 0 0 0 0  Follow up Falls evaluation completed - - - -     Assessment &  Plan  1. Well adult exam  2. Bloating  - COMPLETE METABOLIC PANEL WITH GFR - H. pylori breath test - Ambulatory referral to Gastroenterology  3. Generalized abdominal pain  - COMPLETE METABOLIC PANEL WITH GFR - CBC with Differential/Platelet - Lipase - Ambulatory referral to Gastroenterology  4. Pain with bowel movements  - COMPLETE METABOLIC PANEL WITH GFR - Ambulatory referral to Gastroenterology  5. Lipid screening  - Lipid panel  6. Diabetes mellitus screening  - Hemoglobin A1c  7. Dyspepsia  - H. pylori breath test - Ambulatory referral to Gastroenterology  8. Recurrent vomiting  She missed three days of work due to vomiting last month, discussed FMLA - COMPLETE METABOLIC PANEL WITH GFR - CBC with Differential/Platelet - Lipase - H. pylori breath test - Ambulatory referral to Gastroenterology  9 Recent change in bowel movements  - H. pylori breath test - Ambulatory referral to Gastroenterology  10. Routine screening for STI (sexually transmitted infection)  - HIV Antibody (routine testing w rflx) - [facility]PR - Urine cytology ancillary only  11. Encounter for other general counseling and advice on contraception  - Ambulatory referral to Obstetrics / Gynecology  -USPSTF grade A and B recommendations reviewed with patient; age-appropriate recommendations, preventive care, screening tests, etc discussed and encouraged; healthy living encouraged; see AVS for patient education given to patient -Discussed importance of 150 minutes of physical activity weekly, eat two servings of fish weekly, eat one serving of tree nuts ( cashews, pistachios, pecans, almonds.Marland Kitchen) every other day, eat 6 servings of fruit/vegetables daily and drink plenty of water and avoid sweet beverages.

## 2019-05-08 ENCOUNTER — Telehealth: Payer: Self-pay | Admitting: Obstetrics & Gynecology

## 2019-05-08 LAB — CBC WITH DIFFERENTIAL/PLATELET
Absolute Monocytes: 644 cells/uL (ref 200–950)
Basophils Absolute: 30 cells/uL (ref 0–200)
Basophils Relative: 0.4 %
Eosinophils Absolute: 163 cells/uL (ref 15–500)
Eosinophils Relative: 2.2 %
HCT: 39.3 % (ref 35.0–45.0)
Hemoglobin: 13 g/dL (ref 11.7–15.5)
Lymphs Abs: 3508 cells/uL (ref 850–3900)
MCH: 29.4 pg (ref 27.0–33.0)
MCHC: 33.1 g/dL (ref 32.0–36.0)
MCV: 88.9 fL (ref 80.0–100.0)
MPV: 10.5 fL (ref 7.5–12.5)
Monocytes Relative: 8.7 %
Neutro Abs: 3056 cells/uL (ref 1500–7800)
Neutrophils Relative %: 41.3 %
Platelets: 289 10*3/uL (ref 140–400)
RBC: 4.42 10*6/uL (ref 3.80–5.10)
RDW: 12.2 % (ref 11.0–15.0)
Total Lymphocyte: 47.4 %
WBC: 7.4 10*3/uL (ref 3.8–10.8)

## 2019-05-08 LAB — COMPLETE METABOLIC PANEL WITH GFR
AG Ratio: 1.6 (calc) (ref 1.0–2.5)
ALT: 21 U/L (ref 6–29)
AST: 16 U/L (ref 10–30)
Albumin: 4.4 g/dL (ref 3.6–5.1)
Alkaline phosphatase (APISO): 36 U/L (ref 31–125)
BUN: 11 mg/dL (ref 7–25)
CO2: 27 mmol/L (ref 20–32)
Calcium: 9.5 mg/dL (ref 8.6–10.2)
Chloride: 105 mmol/L (ref 98–110)
Creat: 0.52 mg/dL (ref 0.50–1.10)
GFR, Est African American: 150 mL/min/{1.73_m2} (ref >=60)
GFR, Est Non African American: 129 mL/min/{1.73_m2} (ref >=60)
Globulin: 2.8 g/dL (calc) (ref 1.9–3.7)
Glucose, Bld: 79 mg/dL (ref 65–99)
Potassium: 4.4 mmol/L (ref 3.5–5.3)
Sodium: 140 mmol/L (ref 135–146)
Total Bilirubin: 0.3 mg/dL (ref 0.2–1.2)
Total Protein: 7.2 g/dL (ref 6.1–8.1)

## 2019-05-08 LAB — HEMOGLOBIN A1C
Hgb A1c MFr Bld: 5.2 % of total Hgb (ref <5.7)
Mean Plasma Glucose: 103 (calc)
eAG (mmol/L): 5.7 (calc)

## 2019-05-08 LAB — LIPID PANEL
Cholesterol: 148 mg/dL (ref <200)
HDL: 65 mg/dL (ref >=50)
LDL Cholesterol (Calc): 62 mg/dL (calc)
Non-HDL Cholesterol (Calc): 83 mg/dL (calc) (ref <130)
Total CHOL/HDL Ratio: 2.3 (calc) (ref <5.0)
Triglycerides: 128 mg/dL (ref <150)

## 2019-05-08 LAB — HIV ANTIBODY (ROUTINE TESTING W REFLEX): HIV 1&2 Ab, 4th Generation: NONREACTIVE

## 2019-05-08 LAB — RPR: RPR Ser Ql: NONREACTIVE

## 2019-05-08 LAB — LIPASE: Lipase: 47 U/L (ref 7–60)

## 2019-05-08 LAB — H. PYLORI BREATH TEST: H. pylori Breath Test: NOT DETECTED

## 2019-05-08 NOTE — Telephone Encounter (Signed)
[facility] referring for Encounter for other general counseling and advice on contraception copper IUD. Called and left voicemail for patient to call back to be schedule

## 2019-05-09 LAB — URINE CYTOLOGY ANCILLARY ONLY
Chlamydia: NEGATIVE
Comment: NEGATIVE
Comment: NORMAL
Neisseria Gonorrhea: NEGATIVE

## 2019-05-11 ENCOUNTER — Telehealth: Payer: Self-pay | Admitting: Obstetrics and Gynecology

## 2019-05-11 NOTE — Telephone Encounter (Signed)
Patient is being referred by [facility] referring for Encounter for other general counseling and advice on contraception copper IUD. Patient is schedule for placement on 06/01/19 with [facility]. Advise patient to contact us if her menstrual cycle starts prior to estimated start date.

## 2019-05-11 NOTE — Telephone Encounter (Signed)
Noted. Will order to arrive by apt date/time. 

## 2019-05-21 NOTE — Telephone Encounter (Signed)
Pt is scheduled with [facility] on 3/22.

## 2019-05-25 NOTE — Telephone Encounter (Signed)
Noted. Paragard reserved for this patient. 

## 2019-05-25 NOTE — Telephone Encounter (Signed)
May 21, 2019 Brittney Robinson   JP   10:00 AM Note   Pt is scheduled with [facility] on 3/22.

## 2019-06-01 ENCOUNTER — Encounter: Payer: Commercial Managed Care - PPO | Admitting: Obstetrics and Gynecology

## 2019-06-04 ENCOUNTER — Ambulatory Visit (INDEPENDENT_AMBULATORY_CARE_PROVIDER_SITE_OTHER): Payer: Commercial Managed Care - PPO | Admitting: Obstetrics & Gynecology

## 2019-06-04 ENCOUNTER — Encounter: Payer: Self-pay | Admitting: Obstetrics & Gynecology

## 2019-06-04 ENCOUNTER — Other Ambulatory Visit: Payer: Self-pay

## 2019-06-04 VITALS — BP 120/70 | Ht <= 58 in | Wt 138.0 lb

## 2019-06-04 DIAGNOSIS — Z3009 Encounter for other general counseling and advice on contraception: Secondary | ICD-10-CM

## 2019-06-04 DIAGNOSIS — Z3202 Encounter for pregnancy test, result negative: Secondary | ICD-10-CM

## 2019-06-04 DIAGNOSIS — Z30430 Encounter for insertion of intrauterine contraceptive device: Secondary | ICD-10-CM

## 2019-06-04 LAB — POCT URINE PREGNANCY: Preg Test, Ur: NEGATIVE

## 2019-06-04 NOTE — Progress Notes (Signed)
Consultant: Dr Ancil Boozer Reason: Contraception  Contraception Counseling Patient is a 29 yo G90P2 F who presents for contraception counseling. The patient has no complaints today. The patient is currently sexually active and uses condoms.   Last hormone use was 3 years ago and she reports side effects such as mood changes, acne, and weight gain Pertinent past medical history: none.  PMHx: She  has a past medical history of Anemia and COVID-19 (01/21/2019). Also,  has a past surgical history that includes Cholecystectomy (2013)., family history includes Allergies in her father; Bipolar disorder in her sister; Heart disease in her maternal grandfather, maternal grandmother, paternal grandfather, and paternal grandmother; Hypercholesterolemia in her mother; Hypertension in her maternal aunt and paternal grandmother; Kidney Stones in her sister and sister; Obesity in her maternal aunt and paternal uncle; Schizophrenia in her maternal aunt.,  reports that she has never smoked. She has never used smokeless tobacco. She reports that she does not drink alcohol or use drugs.  She currently has no medications in their medication list. Also, has No Known Allergies.  Review of Systems  Constitutional: Negative for chills, fever and malaise/fatigue.  HENT: Negative for congestion, sinus pain and sore throat.   Eyes: Negative for blurred vision and pain.  Respiratory: Negative for cough and wheezing.   Cardiovascular: Negative for chest pain and leg swelling.  Gastrointestinal: Negative for abdominal pain, constipation, diarrhea, heartburn, nausea and vomiting.  Genitourinary: Negative for dysuria, frequency, hematuria and urgency.  Musculoskeletal: Negative for back pain, joint pain, myalgias and neck pain.  Skin: Negative for itching and rash.  Neurological: Negative for dizziness, tremors and weakness.  Endo/Heme/Allergies: Does not bruise/bleed easily.  Psychiatric/Behavioral: Negative for depression. The  patient is not nervous/anxious and does not have insomnia.     Objective: BP 120/70   Ht 4\' 10"  (1.473 m)   Wt 138 lb (62.6 kg)   LMP 05/25/2019   BMI 28.84 kg/m  Physical Exam Constitutional:      General: She is not in acute distress.    Appearance: She is well-developed.  Genitourinary:     Pelvic exam was performed with patient supine.     Vagina and uterus normal.     No vaginal erythema or bleeding.     No cervical motion tenderness, discharge, polyp or nabothian cyst.     Uterus is mobile.     Uterus is not enlarged.     No uterine mass detected.    Uterus is midaxial.     No right or left adnexal mass present.     Right adnexa not tender.     Left adnexa not tender.  HENT:     Head: Normocephalic and atraumatic.     Nose: Nose normal.  Abdominal:     General: There is no distension.     Palpations: Abdomen is soft.     Tenderness: There is no abdominal tenderness.  Musculoskeletal:        General: Normal range of motion.  Neurological:     Mental Status: She is alert and oriented to person, place, and time.     Cranial Nerves: No cranial nerve deficit.  Skin:    General: Skin is warm and dry.  Psychiatric:        Attention and Perception: Attention normal.        Mood and Affect: Mood and affect normal.        Speech: Speech normal.        Behavior: Behavior normal.  Thought Content: Thought content normal.        Judgment: Judgment normal.     ASSESSMENT/PLAN:    Problem List Items Addressed This Visit    Counseling for birth control regarding intrauterine device (IUD)    -  Primary   Relevant Orders   POCT urine pregnancy (Completed)   Encounter for IUD insertion        Birth Control I discussed multiple birth control options and methods with the patient.  The risks and benefits of each were reviewed.  The possible side effects including deep venous thrombosis, breast tenderness, fluid retention, mood changes and abnormal vaginal bleeding  were discussed.  Combination as well as progesterone-only options, pros and cons counseled.  Non-hormonal options such as Paraguard and Phexxi discussed as well.   IUD PROCEDURE NOTE:  Daylin Raburn is a 29 y.o. [id number] here for IUD insertion. No GYN concerns.  Last pap smear was normal.  She prefers this option to hormonal options as she feels she has weight gain and acne and mood side effects.  IUD Insertion Procedure Note Patient identified, informed consent performed, consent signed.   Discussed risks of irregular bleeding, cramping, infection, malpositioning or misplacement of the IUD outside the uterus which may require further procedure such as laparoscopy, risk of failure <1%. Time out was performed.  Urine pregnancy test negative.  A bimanual exam showed the uterus to be midposition.  Speculum placed in the vagina.  Cervix visualized.  Cleaned with Betadine x 2.  Grasped anteriorly with a single tooth tenaculum.  Uterus sounded to 7 cm.   Paragard IUD placed per manufacturer's recommendations.  Strings trimmed to 3 cm. Tenaculum was removed, good hemostasis noted.  Patient tolerated procedure well.   Patient was given post-procedure instructions.  She was advised to have backup contraception for one week.  Patient was also asked to check IUD strings periodically and follow up in 4 weeks for IUD check.  Barnett Applebaum, MD, [facility], [facility] 06/04/2019  2:48 PM

## 2019-06-04 NOTE — Patient Instructions (Signed)
Intrauterine Device Insertion, Care After  This sheet gives you information about how to care for yourself after your procedure. Your health care provider may also give you more specific instructions. If you have problems or questions, contact your health care provider. What can I expect after the procedure? After the procedure, it is common to have:  Cramps and pain in the abdomen.  Light bleeding (spotting) or heavier bleeding that is like your menstrual period. This may last for up to a few days.  Lower back pain.  Dizziness.  Headaches.  Nausea. Follow these instructions at home:  Before resuming sexual activity, check to make sure that you can feel the IUD string(s). You should be able to feel the end of the string(s) below the opening of your cervix. If your IUD string is in place, you may resume sexual activity. ? If you had a hormonal IUD inserted more than 7 days after your most recent period started, you will need to use a backup method of birth control for 7 days after IUD insertion. Ask your health care provider whether this applies to you.  Continue to check that the IUD is still in place by feeling for the string(s) after every menstrual period, or once a month.  Take over-the-counter and prescription medicines only as told by your health care provider.  Do not drive or use heavy machinery while taking prescription pain medicine.  Keep all follow-up visits as told by your health care provider. This is important. Contact a health care provider if:  You have bleeding that is heavier or lasts longer than a normal menstrual cycle.  You have a fever.  You have cramps or abdominal pain that get worse or do not get better with medicine.  You develop abdominal pain that is new or is not in the same area of earlier cramping and pain.  You feel lightheaded or weak.  You have abnormal or bad-smelling discharge from your vagina.  You have pain during sexual  activity.  You have any of the following problems with your IUD string(s): ? The string bothers or hurts you or your sexual partner. ? You cannot feel the string. ? The string has gotten longer.  You can feel the IUD in your vagina.  You think you may be pregnant, or you miss your menstrual period.  You think you may have an STI (sexually transmitted infection). Get help right away if:  You have flu-like symptoms.  You have a fever and chills.  You can feel that your IUD has slipped out of place. Summary  After the procedure, it is common to have cramps and pain in the abdomen. It is also common to have light bleeding (spotting) or heavier bleeding that is like your menstrual period.  Continue to check that the IUD is still in place by feeling for the string(s) after every menstrual period, or once a month.  Keep all follow-up visits as told by your health care provider. This is important.  Contact your health care provider if you have problems with your IUD string(s), such as the string getting longer or bothering you or your sexual partner. This information is not intended to replace advice given to you by your health care provider. Make sure you discuss any questions you have with your health care provider. Document Revised: 02/11/2017 Document Reviewed: 01/21/2016 Elsevier Patient Education  2020 Elsevier Inc.  

## 2019-07-02 ENCOUNTER — Ambulatory Visit: Payer: Commercial Managed Care - PPO | Admitting: Obstetrics & Gynecology

## 2019-07-10 ENCOUNTER — Ambulatory Visit (INDEPENDENT_AMBULATORY_CARE_PROVIDER_SITE_OTHER): Payer: Commercial Managed Care - PPO | Admitting: Gastroenterology

## 2019-07-10 ENCOUNTER — Encounter: Payer: Self-pay | Admitting: Gastroenterology

## 2019-07-10 ENCOUNTER — Other Ambulatory Visit: Payer: Self-pay

## 2019-07-10 VITALS — BP 111/78 | HR 72 | Temp 98.2°F | Wt 139.2 lb

## 2019-07-10 DIAGNOSIS — R140 Abdominal distension (gaseous): Secondary | ICD-10-CM | POA: Diagnosis not present

## 2019-07-10 DIAGNOSIS — K625 Hemorrhage of anus and rectum: Secondary | ICD-10-CM

## 2019-07-10 DIAGNOSIS — K5904 Chronic idiopathic constipation: Secondary | ICD-10-CM

## 2019-07-10 NOTE — Patient Instructions (Signed)

## 2019-07-10 NOTE — Progress Notes (Signed)
Brittney Darby, MD [address]  [place]  [place], [place] [address]  Main: [phone]  Fax: [phone]    Gastroenterology Consultation  Referring Provider:     Steele Sizer, MD Primary Care Physician:  Steele Sizer, MD Primary Gastroenterologist:  Dr. Cephas Robinson Reason for Consultation: Lower abdominal pain, abdominal bloating, constipation, intermittent rectal bleeding        HPI:   Brittney Robinson is a 29 y.o. female referred by Dr. Steele Sizer, MD  for consultation & management of approximately 2 years history of lower abdominal pain associated with abdominal bloating, intermittent constipation and intermittent bright red blood per rectum.  Patient reports that she was infected with COVID-19 virus in November.  Since then, her GI symptoms have gotten worse.  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  She has not tried any over-the-counter medications to treat constipation.  She denies any weight loss, nausea or vomiting.  She denies diarrhea. He does not smoke or drink alcohol Patient underwent extensive work-up including CBC, CMP, serum lipase, H. pylori breath test, hemoglobin A1c, HIV, RPR all came back unremarkable.  CT renal protocol also came back unremarkable  NSAIDs: None  Antiplts/Anticoagulants/Anti thrombotics: None  GI Procedures: None She denies family history of GI malignancy She denies any GI surgeries  Past Medical History:  Diagnosis Date  . Anemia   . COVID-19 01/21/2019    Past Surgical History:  Procedure Laterality Date  . CHOLECYSTECTOMY  2013    No current outpatient medications on file.   Family History  Problem Relation Age of Onset  . Heart disease Maternal Grandmother   . Heart disease Maternal Grandfather   . Heart disease Paternal Grandmother   . Hypertension Paternal Grandmother   . Heart disease Paternal Grandfather   . Hypercholesterolemia Mother   . Allergies  Father   . Bipolar disorder Sister   . Kidney Stones Sister   . Obesity Maternal Aunt   . Hypertension Maternal Aunt   . Schizophrenia Maternal Aunt   . Obesity Paternal Uncle   . Kidney Stones Sister      Social History   Tobacco Use  . Smoking status: Never Smoker  . Smokeless tobacco: Never Used  Substance Use Topics  . Alcohol use: No  . Drug use: No    Allergies as of 07/10/2019  . (No Known Allergies)    Review of Systems:    All systems reviewed and negative except where noted in HPI.   Physical Exam:  BP 111/78 (BP Location: Left Arm, Patient Position: Sitting, Cuff Size: Normal)   Pulse 72   Temp 98.2 F (36.8 C) (Oral)   Wt 139 lb 4 oz (63.2 kg)   BMI 29.10 kg/m  No LMP recorded.  General:   Alert,  Well-developed, well-nourished, pleasant and cooperative in NAD Head:  Normocephalic and atraumatic. Eyes:  Sclera clear, no icterus.   Conjunctiva pink. Ears:  Normal auditory acuity. Nose:  No deformity, discharge, or lesions. Mouth:  No deformity or lesions,oropharynx pink & moist. Neck:  Supple; no masses or thyromegaly. Lungs:  Respirations even and unlabored.  Clear throughout to auscultation.   No wheezes, crackles, or rhonchi. No acute distress. Heart:  Regular rate and rhythm; no murmurs, clicks, rubs, or gallops. Abdomen:  Normal bowel sounds. Soft, non-tender and mildly distended, tympanic to percussion without masses, hepatosplenomegaly or hernias noted.  No guarding or rebound tenderness.   Rectal: Not  performed Msk:  Symmetrical without gross deformities. Good, equal movement & strength bilaterally. Pulses:  Normal pulses noted. Extremities:  No clubbing or edema.  No cyanosis. Neurologic:  Alert and oriented x3;  grossly normal neurologically. Skin:  Intact without significant lesions or rashes. No jaundice. Psych:  Alert and cooperative. Normal mood and affect.  Imaging Studies: Reviewed  Assessment and Plan:   Brittney Robinson  is a 29 y.o. Spanish-speaking female with 2 years history of intermittent constipation, lower abdominal pain, bloating, intermittent bright red blood per rectum.  Recommend high-fiber diet, information provided Trial of MiraLAX, samples provided Adequate intake of water If her symptoms are persistent, recommend colonoscopy for further evaluation and patient is agreeable   Follow up in 2 months   Brittney Darby, MD

## 2019-09-06 ENCOUNTER — Encounter: Payer: Self-pay | Admitting: *Deleted

## 2019-09-12 ENCOUNTER — Ambulatory Visit: Payer: Commercial Managed Care - PPO | Admitting: Gastroenterology

## 2019-12-11 ENCOUNTER — Ambulatory Visit: Payer: Self-pay

## 2020-05-21 NOTE — Progress Notes (Signed)
Name: Brittney Robinson   MRN: [id number]    DOB: 1991/03/12   Date:05/22/2020       Progress Note  Subjective  Chief Complaint  Annual Exam   HPI  Patient presents for annual CPE.  Diet: balanced diet but has a lot of carbs  Exercise: continue regular physical activity   [place] Office Visit from 05/07/2019 in [facility]  AUDIT-C Score 0     Depression: Phq 9 is  negative Depression screen [facility] 2/9 05/22/2020 05/07/2019 03/06/2019 01/31/2019 12/25/2018  Decreased Interest 0 0 0 0 0  Down, Depressed, Hopeless 0 0 0 1 0  PHQ - 2 Score 0 0 0 1 0  Altered sleeping 0 0 0 0 0  Tired, decreased energy 0 0 0 0 0  Change in appetite 0 0 0 0 0  Feeling bad or failure about yourself  0 0 0 0 0  Trouble concentrating 0 0 0 0 0  Moving slowly or fidgety/restless 0 0 0 0 0  Suicidal thoughts 0 0 0 0 0  PHQ-9 Score 0 0 0 1 0  Difficult doing work/chores Not difficult at all Not difficult at all - - Not difficult at all   Hypertension: BP Readings from Last 3 Encounters:  05/22/20 120/64  07/10/19 111/78  06/04/19 120/70   Obesity: Wt Readings from Last 3 Encounters:  05/22/20 149 lb 6.4 oz (67.8 kg)  07/10/19 139 lb 4 oz (63.2 kg)  06/04/19 138 lb (62.6 kg)   BMI Readings from Last 3 Encounters:  05/22/20 31.22 kg/m  07/10/19 29.10 kg/m  06/04/19 28.84 kg/m     Vaccines:  HPV: up to at age 39 , ask insurance if age between 80-45  Shingrix: 65-64 yo and ask insurance if covered when patient above 81 yo Pneumonia: N/A educated and discussed with patient. Flu: Given at today's visit, educated and discussed with patient.  Hep C Screening: 05/10/2018 STD testing and prevention (HIV/chl/gon/syphilis): 05/07/2019 Intimate partner violence:N/A Sexual History : no pain during sex, married, same partner  Menstrual History/LMP/Abnormal Bleeding: she has an IUD since 2021  Incontinence Symptoms: no problems   Breast cancer:  - BRCA gene  screening: N/A  Osteoporosis: Discussed high calcium and vitamin D supplementation, weight bearing exercises  Cervical cancer screening: 05/17/2018  Skin cancer: Discussed monitoring for atypical lesions  Colorectal cancer: N/A   Advanced Care Planning: A voluntary discussion about advance care planning including the explanation and discussion of advance directives.  Discussed health care proxy and Living will, and the patient was able to identify a health care proxy as husband . She has a living will and power attorney of health care   Lipids: Lab Results  Component Value Date   CHOL 148 05/07/2019   CHOL 144 05/10/2018   Lab Results  Component Value Date   HDL 65 05/07/2019   HDL 59 05/10/2018   Lab Results  Component Value Date   LDLCALC 62 05/07/2019   LDLCALC 65 05/10/2018   Lab Results  Component Value Date   TRIG 128 05/07/2019   TRIG 116 05/10/2018   Lab Results  Component Value Date   CHOLHDL 2.3 05/07/2019   CHOLHDL 2.4 05/10/2018   No results found for: LDLDIRECT  Glucose: Glucose  Date Value Ref Range Status  06/22/2014 103 (H) mg/dL Final    Comment:    65-99 NOTE: New Reference Range  05/21/14    Glucose, Bld  Date Value Ref Range Status  05/07/2019 79 65 - 99 mg/dL Final    Comment:    .            Fasting reference interval .   05/10/2018 81 65 - 99 mg/dL Final    Comment:    .            Fasting reference interval .     Patient Active Problem List   Diagnosis Date Noted  . Atypical mole 05/10/2018  . Refusal of blood transfusions as patient is Jehovah's Witness 04/22/2016    Past Surgical History:  Procedure Laterality Date  . CHOLECYSTECTOMY  2013    Family History  Problem Relation Age of Onset  . Heart disease Maternal Grandmother   . Heart disease Maternal Grandfather   . Heart disease Paternal Grandmother   . Hypertension Paternal Grandmother   . Heart disease Paternal Grandfather   . Hypercholesterolemia Mother    . Allergies Father   . Bipolar disorder Sister   . Kidney Stones Sister   . Obesity Maternal Aunt   . Hypertension Maternal Aunt   . Schizophrenia Maternal Aunt   . Obesity Paternal Uncle   . Kidney Stones Sister     Social History   Socioeconomic History  . Marital status: Married    Spouse name: Jose   . Number of children: 2  . Years of education: Not on file  . Highest education level: 12th grade  Occupational History  . Occupation: CNA  Tobacco Use  . Smoking status: Never Smoker  . Smokeless tobacco: Never Used  Vaping Use  . Vaping Use: Never used  Substance and Sexual Activity  . Alcohol use: No  . Drug use: No  . Sexual activity: Yes    Partners: Male    Birth control/protection: Condom  Other Topics Concern  . Not on file  Social History Narrative   Born in [place], both parents from [place].    Moved to [place] with parents around age 52   She married in [place] also from [place] and they have 2 sons   Social Determinants of Health   Financial Resource Strain: Low Risk   . Difficulty of Paying Living Expenses: Not hard at all  Food Insecurity: No Food Insecurity  . Worried About Charity fundraiser in the Last Year: Never true  . Ran Out of Food in the Last Year: Never true  Transportation Needs: No Transportation Needs  . Lack of Transportation (Medical): No  . Lack of Transportation (Non-Medical): No  Physical Activity: Sufficiently Active  . Days of Exercise per Week: 3 days  . Minutes of Exercise per Session: 60 min  Stress: No Stress Concern Present  . Feeling of Stress : Only a little  Social Connections: Moderately Integrated  . Frequency of Communication with Friends and Family: Twice a week  . Frequency of Social Gatherings with Friends and Family: Twice a week  . Attends Religious Services: 1 to 4 times per year  . Active Member of Clubs or Organizations: No  . Attends Archivist Meetings: Never  . Marital Status: Married  Arboriculturist Violence: Not At Risk  . Fear of Current or Ex-Partner: No  . Emotionally Abused: No  . Physically Abused: No  . Sexually Abused: No    No current outpatient medications on file.  No Known Allergies   ROS  Constitutional: Negative for fever , positive for weight change.  Respiratory: Negative for cough and shortness of breath.  Cardiovascular: Negative for chest pain or palpitations.  Gastrointestinal: Negative for abdominal pain, no bowel changes.  Musculoskeletal: Negative for gait problem or joint swelling.  Skin: Negative for rash.  Neurological: Negative for dizziness or headache.  No other specific complaints in a complete review of systems (except as listed in HPI above).  Objective  Vitals:   05/22/20 0838  BP: 120/64  Pulse: 87  Resp: 16  Temp: 98.1 F (36.7 C)  SpO2: 99%  Weight: 149 lb 6.4 oz (67.8 kg)  Height: $Remove'4\' 10"'yxOuQvR$  (1.473 m)    Body mass index is 31.22 kg/m.  Physical Exam  Constitutional: Patient appears well-developed and well-nourished. No distress.  HENT: Head: Normocephalic and atraumatic. Ears: B TMs ok, no erythema or effusion; Nose: Not done  Mouth/Throat: not done  Eyes: Conjunctivae and EOM are normal. Pupils are equal, round, and reactive to light. No scleral icterus.  Neck: Normal range of motion. Neck supple. No JVD present. No thyromegaly present.  Cardiovascular: Normal rate, regular rhythm and normal heart sounds.  No murmur heard. No BLE edema. Pulmonary/Chest: Effort normal and breath sounds normal. No respiratory distress. Abdominal: Soft. Bowel sounds are normal, no distension. There is no tenderness. no masses Breast: no lumps or masses, no nipple discharge or rashes FEMALE GENITALIA:  Not done  RECTAL: not done  Musculoskeletal: Normal range of motion, no joint effusions. No gross deformities Neurological: he is alert and oriented to person, place, and time. No cranial nerve deficit. Coordination, balance, strength,  speech and gait are normal.  Skin: Skin is warm and dry. No rash noted. No erythema.  Psychiatric: Patient has a normal mood and affect. behavior is normal. Judgment and thought content normal.  Fall Risk: Fall Risk  05/22/2020 05/07/2019 03/06/2019 01/31/2019 12/25/2018  Falls in the past year? 0 0 0 0 0  Number falls in past yr: 0 0 0 0 0  Injury with Fall? 0 0 0 0 0  Follow up - Falls evaluation completed - - -    Functional Status Survey: Is the patient deaf or have difficulty hearing?: No Does the patient have difficulty seeing, even when wearing glasses/contacts?: No Does the patient have difficulty concentrating, remembering, or making decisions?: No Does the patient have difficulty walking or climbing stairs?: No Does the patient have difficulty dressing or bathing?: No Does the patient have difficulty doing errands alone such as visiting a doctor's office or shopping?: No   Assessment & Plan  1. Well adult exam  - CBC with Differential/Platelet - COMPLETE METABOLIC PANEL WITH GFR - Lipid panel - Hemoglobin A1c - TSH  2. Lipid screening  - Lipid panel  3. Diabetes mellitus screening  - Hemoglobin A1c  4. Weight gain  - TSH  5. Screening for deficiency anemia  - CBC with Differential/Platelet  6. Obesity (BMI 30.0-34.9)  Discussed with the patient the risk posed by an increased BMI. Discussed importance of portion control, calorie counting and at least 150 minutes of physical activity weekly. Avoid sweet beverages and drink more water. Eat at least 6 servings of fruit and vegetables daily  Discussed Weight Watchers and low carb diet   -USPSTF grade A and B recommendations reviewed with patient; age-appropriate recommendations, preventive care, screening tests, etc discussed and encouraged; healthy living encouraged; see AVS for patient education given to patient -Discussed importance of 150 minutes of physical activity weekly, eat two servings of fish weekly,  eat one serving of tree nuts ( cashews, pistachios, pecans, almonds.Marland Kitchen) every  other day, eat 6 servings of fruit/vegetables daily and drink plenty of water and avoid sweet beverages.

## 2020-05-21 NOTE — Patient Instructions (Signed)
Preventive Care 21-30 Years Old, Female Preventive care refers to lifestyle choices and visits with your health care provider that can promote health and wellness. This includes:  A yearly physical exam. This is also called an annual wellness visit.  Regular dental and eye exams.  Immunizations.  Screening for certain conditions.  Healthy lifestyle choices, such as: ? Eating a healthy diet. ? Getting regular exercise. ? Not using drugs or products that contain nicotine and tobacco. ? Limiting alcohol use. What can I expect for my preventive care visit? Physical exam Your health care provider may check your:  Height and weight. These may be used to calculate your BMI (body mass index). BMI is a measurement that tells if you are at a healthy weight.  Heart rate and blood pressure.  Body temperature.  Skin for abnormal spots. Counseling Your health care provider may ask you questions about your:  Past medical problems.  Family's medical history.  Alcohol, tobacco, and drug use.  Emotional well-being.  Home life and relationship well-being.  Sexual activity.  Diet, exercise, and sleep habits.  Work and work environment.  Access to firearms.  Method of birth control.  Menstrual cycle.  Pregnancy history. What immunizations do I need? Vaccines are usually given at various ages, according to a schedule. Your health care provider will recommend vaccines for you based on your age, medical history, and lifestyle or other factors, such as travel or where you work.   What tests do I need? Blood tests  Lipid and cholesterol levels. These may be checked every 5 years starting at age 20.  Hepatitis C test.  Hepatitis B test. Screening  Diabetes screening. This is done by checking your blood sugar (glucose) after you have not eaten for a while (fasting).  STD (sexually transmitted disease) testing, if you are at risk.  BRCA-related cancer screening. This may be  done if you have a family history of breast, ovarian, tubal, or peritoneal cancers.  Pelvic exam and Pap test. This may be done every 3 years starting at age 21. Starting at age 30, this may be done every 5 years if you have a Pap test in combination with an HPV test. Talk with your health care provider about your test results, treatment options, and if necessary, the need for more tests.   Follow these instructions at home: Eating and drinking  Eat a healthy diet that includes fresh fruits and vegetables, whole grains, lean protein, and low-fat dairy products.  Take vitamin and mineral supplements as recommended by your health care provider.  Do not drink alcohol if: ? Your health care provider tells you not to drink. ? You are pregnant, may be pregnant, or are planning to become pregnant.  If you drink alcohol: ? Limit how much you have to 0-1 drink a day. ? Be aware of how much alcohol is in your drink. In the [place] one drink equals one 12 oz bottle of beer (355 mL), one 5 oz glass of wine (148 mL), or one 1 oz glass of hard liquor (44 mL).   Lifestyle  Take daily care of your teeth and gums. Brush your teeth every morning and night with fluoride toothpaste. Floss one time each day.  Stay active. Exercise for at least 30 minutes 5 or more days each week.  Do not use any products that contain nicotine or tobacco, such as cigarettes, e-cigarettes, and chewing tobacco. If you need help quitting, ask your health care provider.  Do not   use drugs.  If you are sexually active, practice safe sex. Use a condom or other form of protection to prevent STIs (sexually transmitted infections).  If you do not wish to become pregnant, use a form of birth control. If you plan to become pregnant, see your health care provider for a prepregnancy visit.  Find healthy ways to cope with stress, such as: ? Meditation, yoga, or listening to music. ? Journaling. ? Talking to a trusted  person. ? Spending time with friends and family. Safety  Always wear your seat belt while driving or riding in a vehicle.  Do not drive: ? If you have been drinking alcohol. Do not ride with someone who has been drinking. ? When you are tired or distracted. ? While texting.  Wear a helmet and other protective equipment during sports activities.  If you have firearms in your house, make sure you follow all gun safety procedures.  Seek help if you have been physically or sexually abused. What's next?  Go to your health care provider once a year for an annual wellness visit.  Ask your health care provider how often you should have your eyes and teeth checked.  Stay up to date on all vaccines. This information is not intended to replace advice given to you by your health care provider. Make sure you discuss any questions you have with your health care provider. Document Revised: 10/28/2019 Document Reviewed: 11/10/2017 Elsevier Patient Education  2021 Elsevier Inc.  

## 2020-05-22 ENCOUNTER — Encounter: Payer: Self-pay | Admitting: Family Medicine

## 2020-05-22 ENCOUNTER — Ambulatory Visit (INDEPENDENT_AMBULATORY_CARE_PROVIDER_SITE_OTHER): Payer: Commercial Managed Care - PPO | Admitting: Family Medicine

## 2020-05-22 ENCOUNTER — Other Ambulatory Visit: Payer: Self-pay

## 2020-05-22 VITALS — BP 120/64 | HR 87 | Temp 98.1°F | Resp 16 | Ht <= 58 in | Wt 149.4 lb

## 2020-05-22 DIAGNOSIS — Z131 Encounter for screening for diabetes mellitus: Secondary | ICD-10-CM

## 2020-05-22 DIAGNOSIS — Z0000 Encounter for general adult medical examination without abnormal findings: Secondary | ICD-10-CM | POA: Diagnosis not present

## 2020-05-22 DIAGNOSIS — Z130 Encounter for screening for diseases of the blood and blood-forming organs and certain disorders involving the immune mechanism: Secondary | ICD-10-CM

## 2020-05-22 DIAGNOSIS — R635 Abnormal weight gain: Secondary | ICD-10-CM

## 2020-05-22 DIAGNOSIS — E669 Obesity, unspecified: Secondary | ICD-10-CM

## 2020-05-22 DIAGNOSIS — Z13220 Encounter for screening for lipoid disorders: Secondary | ICD-10-CM

## 2020-05-23 LAB — LIPID PANEL
Cholesterol: 155 mg/dL (ref <200)
HDL: 57 mg/dL (ref >=50)
LDL Cholesterol (Calc): 74 mg/dL (calc)
Non-HDL Cholesterol (Calc): 98 mg/dL (calc) (ref <130)
Total CHOL/HDL Ratio: 2.7 (calc) (ref <5.0)
Triglycerides: 161 mg/dL — ABNORMAL HIGH (ref <150)

## 2020-05-23 LAB — CBC WITH DIFFERENTIAL/PLATELET
Absolute Monocytes: 412 cells/uL (ref 200–950)
Basophils Absolute: 43 cells/uL (ref 0–200)
Basophils Relative: 0.6 %
Eosinophils Absolute: 142 cells/uL (ref 15–500)
Eosinophils Relative: 2 %
HCT: 39.3 % (ref 35.0–45.0)
Hemoglobin: 13.2 g/dL (ref 11.7–15.5)
Lymphs Abs: 3458 cells/uL (ref 850–3900)
MCH: 29.3 pg (ref 27.0–33.0)
MCHC: 33.6 g/dL (ref 32.0–36.0)
MCV: 87.3 fL (ref 80.0–100.0)
MPV: 10.6 fL (ref 7.5–12.5)
Monocytes Relative: 5.8 %
Neutro Abs: 3046 cells/uL (ref 1500–7800)
Neutrophils Relative %: 42.9 %
Platelets: 285 10*3/uL (ref 140–400)
RBC: 4.5 10*6/uL (ref 3.80–5.10)
RDW: 12.6 % (ref 11.0–15.0)
Total Lymphocyte: 48.7 %
WBC: 7.1 10*3/uL (ref 3.8–10.8)

## 2020-05-23 LAB — TSH: TSH: 2.18 mIU/L

## 2020-05-23 LAB — HEMOGLOBIN A1C
Hgb A1c MFr Bld: 5.3 % of total Hgb (ref <5.7)
Mean Plasma Glucose: 105 mg/dL
eAG (mmol/L): 5.8 mmol/L

## 2020-05-23 LAB — COMPLETE METABOLIC PANEL WITH GFR
AG Ratio: 1.5 (calc) (ref 1.0–2.5)
ALT: 30 U/L — ABNORMAL HIGH (ref 6–29)
AST: 30 U/L (ref 10–30)
Albumin: 4.5 g/dL (ref 3.6–5.1)
Alkaline phosphatase (APISO): 34 U/L (ref 31–125)
BUN: 12 mg/dL (ref 7–25)
CO2: 25 mmol/L (ref 20–32)
Calcium: 9.6 mg/dL (ref 8.6–10.2)
Chloride: 105 mmol/L (ref 98–110)
Creat: 0.54 mg/dL (ref 0.50–1.10)
GFR, Est African American: 147 mL/min/{1.73_m2} (ref >=60)
GFR, Est Non African American: 127 mL/min/{1.73_m2} (ref >=60)
Globulin: 3 g/dL (calc) (ref 1.9–3.7)
Glucose, Bld: 82 mg/dL (ref 65–99)
Potassium: 4 mmol/L (ref 3.5–5.3)
Sodium: 139 mmol/L (ref 135–146)
Total Bilirubin: 0.4 mg/dL (ref 0.2–1.2)
Total Protein: 7.5 g/dL (ref 6.1–8.1)

## 2020-08-06 ENCOUNTER — Encounter: Payer: Self-pay | Admitting: Family Medicine

## 2020-09-15 IMAGING — CT CT RENAL STONE PROTOCOL
1 of 2 series · 15 of 32 positions shown, 19 images · non-contrast
Comparison: June 26, 2014

CLINICAL DATA: Left flank region pain

EXAM:
CT ABDOMEN AND PELVIS WITHOUT CONTRAST
TECHNIQUE: Multidetector CT imaging of the abdomen and pelvis was performed
following the standard protocol without oral or IV contrast.

[Series 2: axial st · axial · 0.63mm/px · z∈[-794,-404]mm · 15 of 86 slices shown, 19 images]
[im 4/86  soft-tissue]
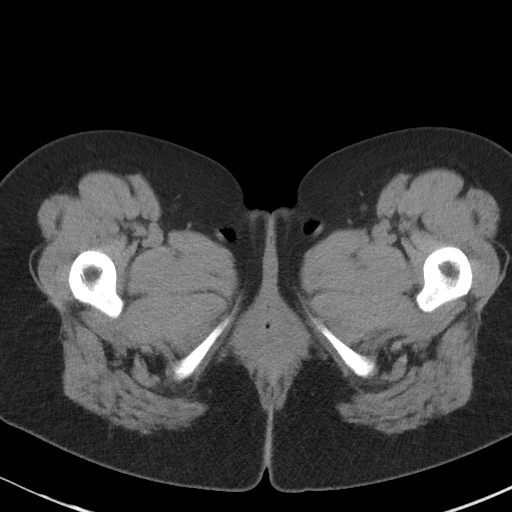
[im 4/86  bone]
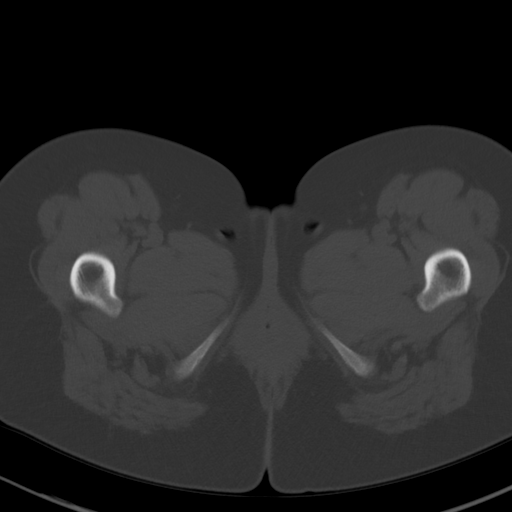
[im 11/86  soft-tissue]
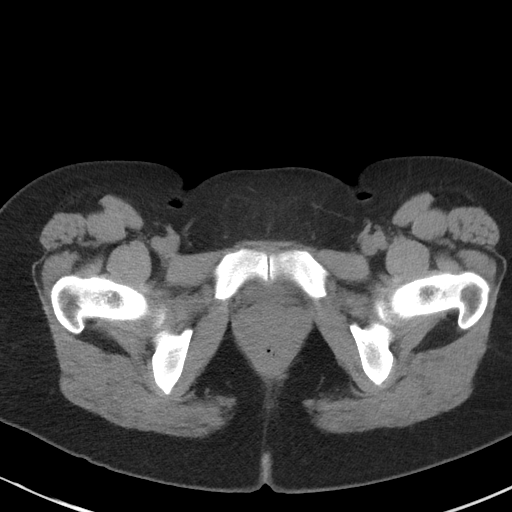
[im 18/86  soft-tissue]
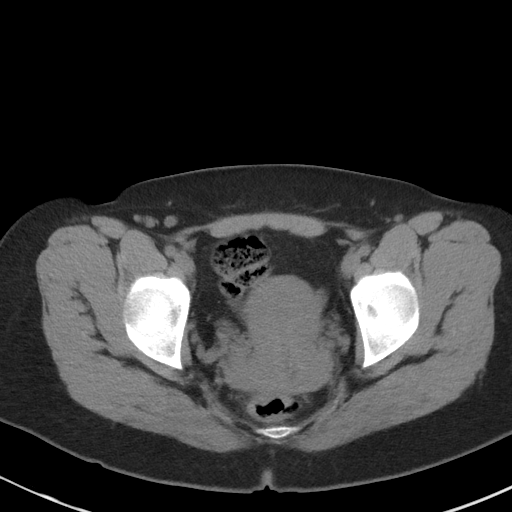
[im 24/86  soft-tissue]
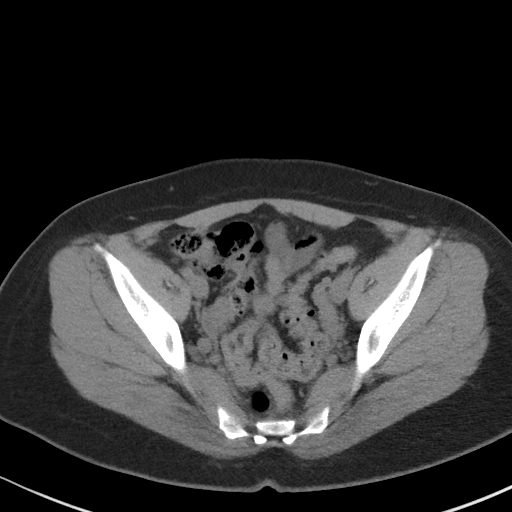
[im 31/86  soft-tissue]
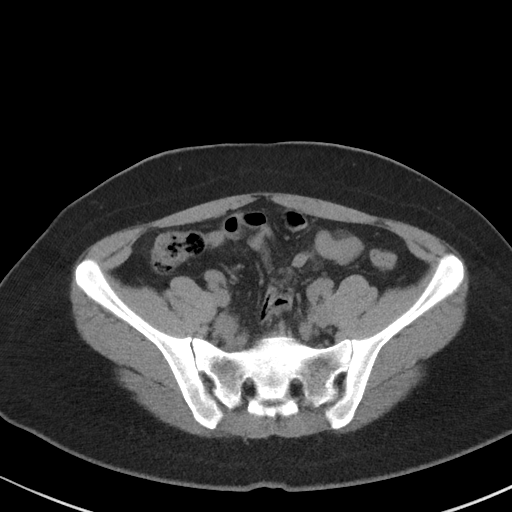
[im 38/86  soft-tissue]
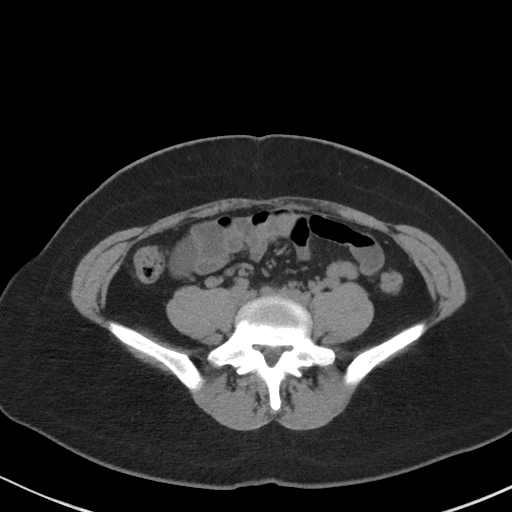
[im 45/86  soft-tissue]
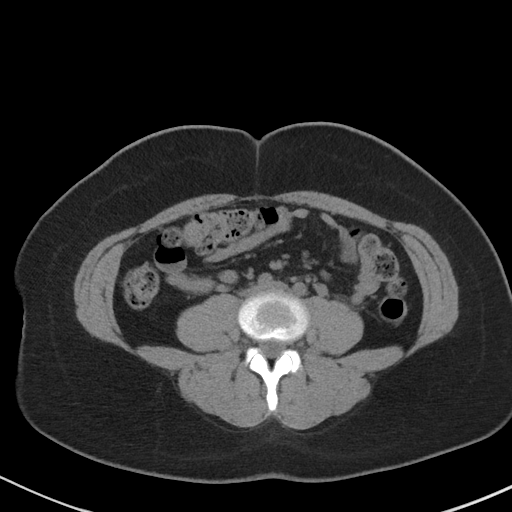
[im 48/86  soft-tissue]
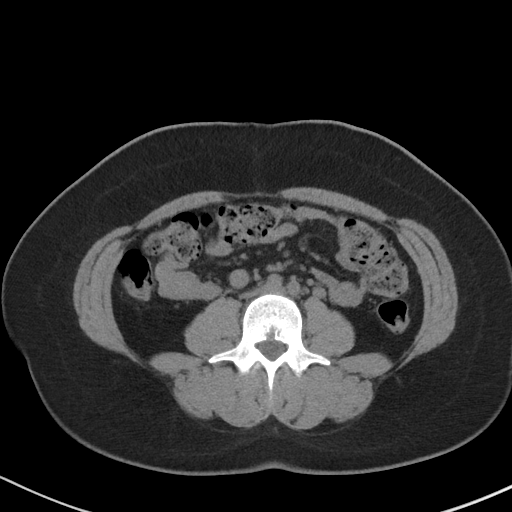
[im 55/86  soft-tissue]
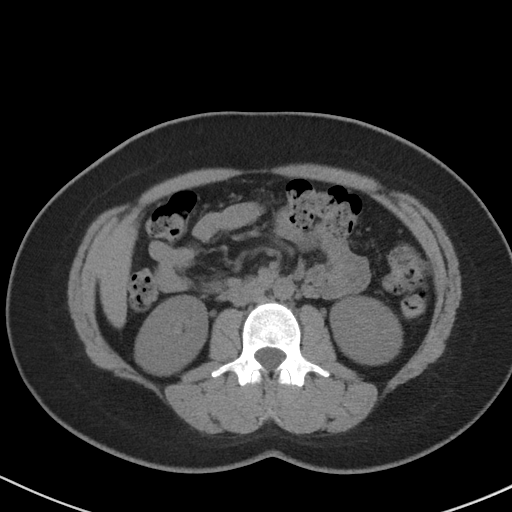
[im 55/86  bone]
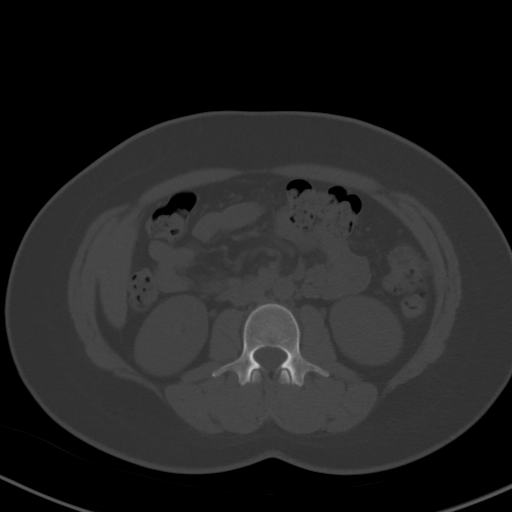
[im 62/86  soft-tissue]
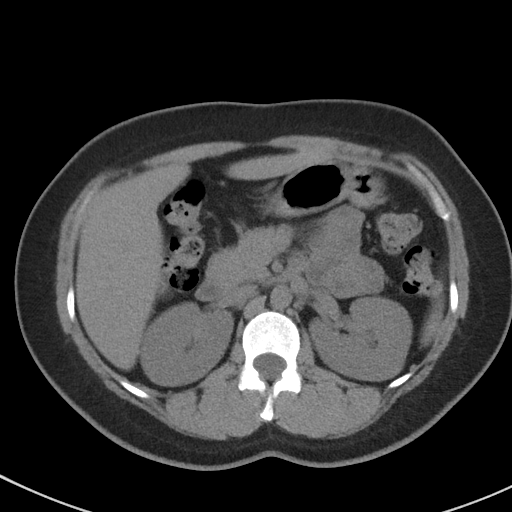
[im 69/86  soft-tissue]
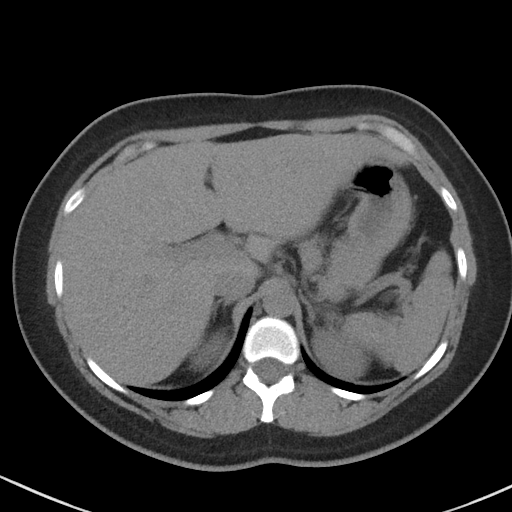
[im 72/86  lung]
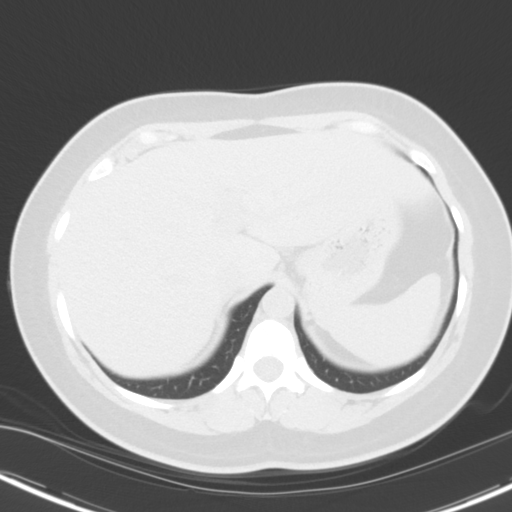
[im 75/86  soft-tissue]
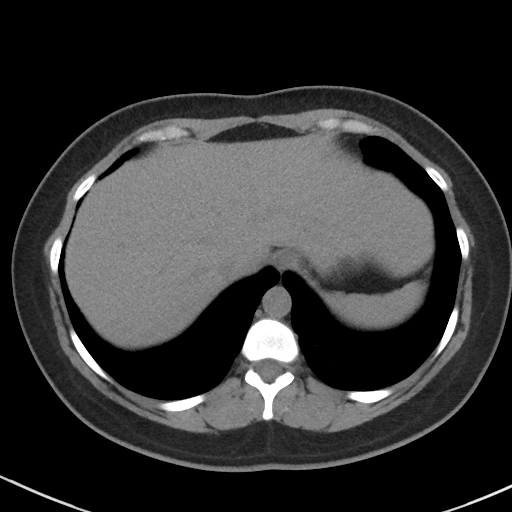
[im 75/86  lung]
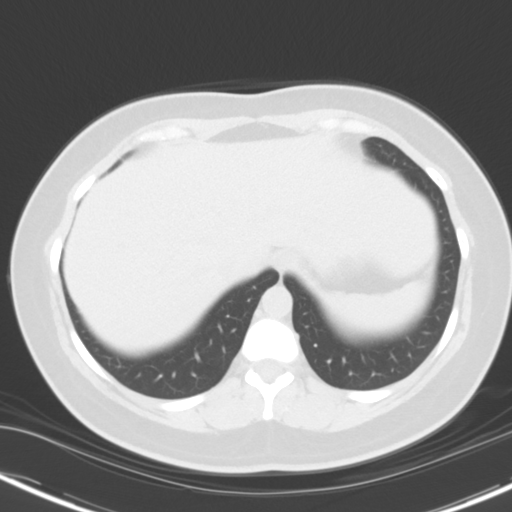
[im 79/86  lung]
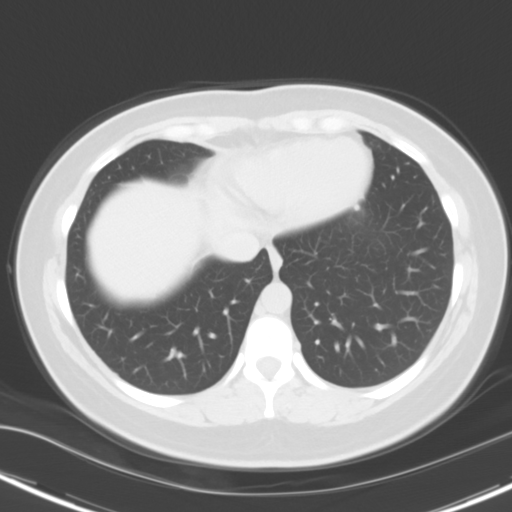
[im 82/86  soft-tissue]
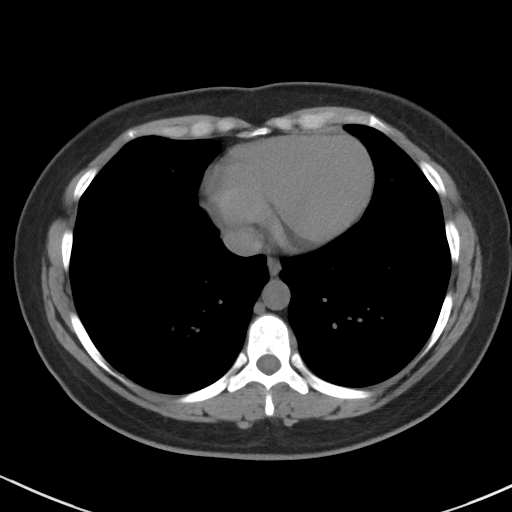
[im 82/86  lung]
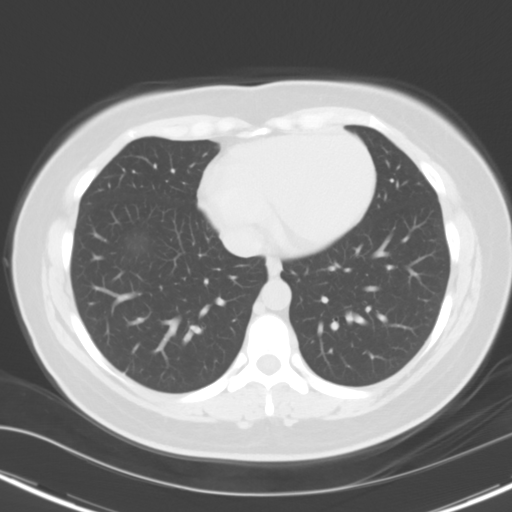

[15 of 32 positions shown; findings below may reference images not displayed]

FINDINGS: Lower chest: Lung bases are clear.

Hepatobiliary: No focal liver lesions are evident on this
noncontrast enhanced study. Gallbladder is absent. There is no
appreciable biliary duct dilatation.

Pancreas: There is no pancreatic mass or inflammatory focus.

Spleen: No splenic lesions are evident.

Adrenals/Urinary Tract: Adrenals bilaterally appear unremarkable.
Kidneys bilaterally show no evident mass or hydronephrosis on either
side. There is no appreciable renal or ureteral calculus on either
side. Urinary bladder is midline with wall thickness within normal
limits.

Stomach/Bowel: There are occasional sigmoid diverticula without
diverticulitis. There is no appreciable bowel wall or mesenteric
thickening. There is no evident bowel obstruction. No free air or
portal venous air.

Vascular/Lymphatic: No abdominal aortic aneurysm. No vascular
lesions are appreciable this noncontrast enhanced study. There is no
evident adenopathy in the abdomen or pelvis.

Reproductive: Uterus is anteverted.  No evident pelvic mass.

Other: No periappendiceal region inflammation is evident. There is
no abscess or ascites in the abdomen or pelvis.

Musculoskeletal: No blastic or lytic bone lesions. No intramuscular
or abdominal wall lesion evident.
IMPRESSION: 1. No hydronephrosis on either side. No evident renal or ureteral
calculus on either side.

2. Occasional sigmoid diverticula without diverticulitis. No bowel
obstruction. No abscess in the abdomen or pelvis. No
appendiceal/periappendiceal region inflammation.

Comment: A cause for patient's symptoms has not been established
with this study.

These results will be called to the ordering clinician or
representative by the [HOSPITAL] at the imaging location.

## 2020-12-19 ENCOUNTER — Encounter: Payer: Self-pay | Admitting: Nurse Practitioner

## 2020-12-19 ENCOUNTER — Ambulatory Visit (INDEPENDENT_AMBULATORY_CARE_PROVIDER_SITE_OTHER): Payer: Commercial Managed Care - PPO | Admitting: Nurse Practitioner

## 2020-12-19 ENCOUNTER — Other Ambulatory Visit: Payer: Self-pay

## 2020-12-19 VITALS — BP 108/72 | HR 96 | Temp 98.2°F | Resp 18 | Ht <= 58 in | Wt 150.1 lb

## 2020-12-19 DIAGNOSIS — R21 Rash and other nonspecific skin eruption: Secondary | ICD-10-CM

## 2020-12-19 DIAGNOSIS — G44219 Episodic tension-type headache, not intractable: Secondary | ICD-10-CM

## 2020-12-19 NOTE — Progress Notes (Signed)
BP 108/72   Pulse 96   Temp 98.2 F (36.8 C)   Resp 18   Ht 4\' 10"  (1.473 m)   Wt 150 lb 1.6 oz (68.1 kg)   SpO2 98%   BMI 31.37 kg/m    Subjective:    Patient ID: Brittney Robinson, female    DOB: 1990-07-19, 30 y.o.   MRN: [id number]  HPI: Brittney Robinson is a 30 y.o. female  Chief Complaint  Patient presents with   Rash   Rash:  She says she has had a rash off and on for a month.  She says it started between her thighs and now she has it in her arm pits and under her breasts.  She says the rash is sometimes itchy.  She has been taking benadryl every once in awhile and she has been using hydrocortisone cream.  She says the only thing she has changed is her laundry detergent.  Discussed switching back to original detergent.  Taking benadryl and pepcid.  She does not want a course of steroids at this time.   Headache:  She says she has had two headaches in the last month.  She says they start in the front and go across the top of her head.  She describes the pain as a pressure. She does experience nausea with it.  She has taken ibuprofen and tylenol for the pain and that helps.  Discussed drinking plenty of fluids and keeping a headache journal.  Continue taking the tylenol and ibuprofen for pain.   Relevant past medical, surgical, family and social history reviewed and updated as indicated. Interim medical history since our last visit reviewed. Allergies and medications reviewed and updated.  Review of Systems  Constitutional: Negative for fever or weight change.  Respiratory: Negative for cough and shortness of breath.   Cardiovascular: Negative for chest pain or palpitations.  Gastrointestinal: Negative for abdominal pain, no bowel changes.  Musculoskeletal: Negative for gait problem or joint swelling.  Skin: Positive for rash.  Neurological: Negative for dizziness, positive for headache.  No other specific complaints in a complete review of systems  (except as listed in HPI above).      Objective:    BP 108/72   Pulse 96   Temp 98.2 F (36.8 C)   Resp 18   Ht 4\' 10"  (1.473 m)   Wt 150 lb 1.6 oz (68.1 kg)   SpO2 98%   BMI 31.37 kg/m   Wt Readings from Last 3 Encounters:  12/19/20 150 lb 1.6 oz (68.1 kg)  05/22/20 149 lb 6.4 oz (67.8 kg)  07/10/19 139 lb 4 oz (63.2 kg)    Physical Exam  Constitutional: Patient appears well-developed and well-nourished. No distress.  HEENT: head atraumatic, normocephalic, pupils equal and reactive to light,  neck supple, throat within normal limits Cardiovascular: Normal rate, regular rhythm and normal heart sounds.  No murmur heard. No BLE edema. Pulmonary/Chest: Effort normal and breath sounds normal. No respiratory distress. Abdominal: Soft.  There is no tenderness Skin: Rash noted to bilateral axilla Psychiatric: Patient has a normal mood and affect. behavior is normal. Judgment and thought content normal.   Results for orders placed or performed in visit on 05/22/20  CBC with Differential/Platelet  Result Value Ref Range   WBC 7.1 3.8 - 10.8 Thousand/uL   RBC 4.50 3.80 - 5.10 Million/uL   Hemoglobin 13.2 11.7 - 15.5 g/dL   HCT 39.3 35.0 - 45.0 %   MCV 87.3 80.0 -  100.0 fL   MCH 29.3 27.0 - 33.0 pg   MCHC 33.6 32.0 - 36.0 g/dL   RDW 12.6 11.0 - 15.0 %   Platelets 285 140 - 400 Thousand/uL   MPV 10.6 7.5 - 12.5 fL   Neutro Abs 3,046 1,500 - 7,800 cells/uL   Lymphs Abs 3,458 850 - 3,900 cells/uL   Absolute Monocytes 412 200 - 950 cells/uL   Eosinophils Absolute 142 15 - 500 cells/uL   Basophils Absolute 43 0 - 200 cells/uL   Neutrophils Relative % 42.9 %   Total Lymphocyte 48.7 %   Monocytes Relative 5.8 %   Eosinophils Relative 2.0 %   Basophils Relative 0.6 %  COMPLETE METABOLIC PANEL WITH GFR  Result Value Ref Range   Glucose, Bld 82 65 - 99 mg/dL   BUN 12 7 - 25 mg/dL   Creat 0.54 0.50 - 1.10 mg/dL   GFR, Est Non African American 127 > OR = 60 mL/min/1.1m2   GFR,  Est African American 147 > OR = 60 mL/min/1.76m2   BUN/Creatinine Ratio NOT APPLICABLE 6 - 22 (calc)   Sodium 139 135 - 146 mmol/L   Potassium 4.0 3.5 - 5.3 mmol/L   Chloride 105 98 - 110 mmol/L   CO2 25 20 - 32 mmol/L   Calcium 9.6 8.6 - 10.2 mg/dL   Total Protein 7.5 6.1 - 8.1 g/dL   Albumin 4.5 3.6 - 5.1 g/dL   Globulin 3.0 1.9 - 3.7 g/dL (calc)   AG Ratio 1.5 1.0 - 2.5 (calc)   Total Bilirubin 0.4 0.2 - 1.2 mg/dL   Alkaline phosphatase (APISO) 34 31 - 125 U/L   AST 30 10 - 30 U/L   ALT 30 (H) 6 - 29 U/L  Lipid panel  Result Value Ref Range   Cholesterol 155 <200 mg/dL   HDL 57 > OR = 50 mg/dL   Triglycerides 161 (H) <150 mg/dL   LDL Cholesterol (Calc) 74 mg/dL (calc)   Total CHOL/HDL Ratio 2.7 <5.0 (calc)   Non-HDL Cholesterol (Calc) 98 <130 mg/dL (calc)  Hemoglobin A1c  Result Value Ref Range   Hgb A1c MFr Bld 5.3 <5.7 % of total Hgb   Mean Plasma Glucose 105 mg/dL   eAG (mmol/L) 5.8 mmol/L  TSH  Result Value Ref Range   TSH 2.18 mIU/L      Assessment & Plan:   1. Rash of body - continue benadryl, add pepcid -use barrier cream with hydrocortisone  - Ambulatory referral to Allergy  2. Episodic tension-type headache, not intractable -keep headache journal -continue to treat with ibuprofen and tylenol as needed  Follow up plan: No follow-ups on file.

## 2021-01-07 NOTE — Telephone Encounter (Signed)
Paragard rcvd/charged 06/04/2019

## 2021-02-17 NOTE — Progress Notes (Deleted)
NEW PATIENT Date of Service/Encounter:  02/17/21 Referring provider: Steele Sizer, MD Primary care provider: Steele Sizer, MD  Subjective:  Brittney Robinson is a 30 y.o. female presenting today for evaluation of rash History obtained from: chart review and {Persons; PED relatives w/patient:19415::"patient"}.   Rash: started ***, occurring *** Associates *** Denies other systemic symptoms including no respiratory, gastrointestinal or cardiovascular distress. Did change her laundry detergent ***, but denies other changes to personal care products,, diet etc Therapies tried: Benadryl PRN, hydrocortisone PRN Potential triggers: *** Does *** associate fever, joint pain, joint swelling, weight loss.  Lesions resolve in ***  *** bruising on resolution. *** recent illness. Pictures reviewed ***  TSH, A1C, normal on 05/22/20; AEC 142 on 05/22/20 Other allergy screening: Asthma: {Blank single:19197::"yes","no"} Rhino conjunctivitis: {Blank single:19197::"yes","no"} Food allergy: {Blank single:19197::"yes","no"} Medication allergy: {Blank single:19197::"yes","no"} Hymenoptera allergy: {Blank single:19197::"yes","no"} Urticaria: {Blank single:19197::"yes","no"} Eczema:{Blank single:19197::"yes","no"} History of recurrent infections suggestive of immunodeficency: {Blank single:19197::"yes","no"} ***Vaccinations are up to date.   Past Medical History: Past Medical History:  Diagnosis Date   Anemia    COVID-19 01/21/2019   Medication List:  No current outpatient medications on file.   No current facility-administered medications for this visit.   Known Allergies:  No Known Allergies Past Surgical History: Past Surgical History:  Procedure Laterality Date   CHOLECYSTECTOMY  2013   Family History: Family History  Problem Relation Age of Onset   Heart disease Maternal Grandmother    Heart disease Maternal Grandfather    Heart disease Paternal Grandmother     Hypertension Paternal Grandmother    Heart disease Paternal Grandfather    Hypercholesterolemia Mother    Allergies Father    Bipolar disorder Sister    Kidney Stones Sister    Obesity Maternal Aunt    Hypertension Maternal Aunt    Schizophrenia Maternal Aunt    Obesity Paternal Uncle    Kidney Stones Sister    Social History: [place] lives ***.   ROS:  All other systems negative except as noted per HPI.  Objective:  There were no vitals taken for this visit. There is no height or weight on file to calculate BMI. Physical Exam:  General Appearance:  Alert, cooperative, no distress, appears stated age  Head:  Normocephalic, without obvious abnormality, atraumatic  Eyes:  Conjunctiva clear, EOM's intact  Nose: Nares normal  Throat: Lips, tongue normal; teeth and gums normal  Neck: Supple, symmetrical  Lungs:   Respirations unlabored, no coughing  Heart:  Appears well perfused  Extremities: No edema  Skin: Skin color, texture, turgor normal, no rashes or lesions on visualized portions of skin  Neurologic: No gross deficits     Diagnostics: Spirometry:  Tracings reviewed. Her effort: {Blank single:19197::"Good reproducible efforts.","It was hard to get consistent efforts and there is a question as to whether this reflects a maximal maneuver.","Poor effort, data can not be interpreted."} FVC: ***L FEV1: ***L, ***% predicted FEV1/FVC ratio: ***% Interpretation: {Blank single:19197::"Spirometry consistent with mild obstructive disease","Spirometry consistent with moderate obstructive disease","Spirometry consistent with severe obstructive disease","Spirometry consistent with possible restrictive disease","Spirometry consistent with mixed obstructive and restrictive disease","Spirometry uninterpretable due to technique","Spirometry consistent with normal pattern","No overt abnormalities noted given today's efforts"}.  Please see scanned spirometry results for details.  Skin  Testing: {Blank single:19197::"Select foods","Environmental allergy panel","Environmental allergy panel and select foods","Food allergy panel","None","Deferred due to recent antihistamines use"}. Positive test to: ***. Negative test to: ***.  Results discussed with patient/family.   {Blank single:19197::"Allergy testing results were read and interpreted by  myself, documented by clinical staff."," "}  Assessment and Plan  There are no Patient Instructions on file for this visit.  No follow-ups on file.  {Blank single:19197::"This note in its entirety was forwarded to the Provider who requested this consultation."}  Thank you for your kind referral. I appreciate the opportunity to take part in [place] care. Please do not hesitate to contact me with questions.***  Sincerely,  Sigurd Sos, MD [facility]

## 2021-02-18 ENCOUNTER — Ambulatory Visit: Payer: Commercial Managed Care - PPO | Admitting: Internal Medicine

## 2021-04-20 ENCOUNTER — Other Ambulatory Visit: Payer: Self-pay

## 2021-04-20 ENCOUNTER — Ambulatory Visit (INDEPENDENT_AMBULATORY_CARE_PROVIDER_SITE_OTHER): Payer: BC Managed Care – PPO | Admitting: Obstetrics and Gynecology

## 2021-04-20 ENCOUNTER — Other Ambulatory Visit (HOSPITAL_COMMUNITY)
Admission: RE | Admit: 2021-04-20 | Discharge: 2021-04-20 | Disposition: A | Discharge status: Home / Self Care | Payer: Self-pay | Source: Ambulatory Visit | Attending: Obstetrics and Gynecology | Admitting: Obstetrics and Gynecology

## 2021-04-20 ENCOUNTER — Encounter: Payer: Self-pay | Admitting: Obstetrics and Gynecology

## 2021-04-20 VITALS — BP 112/80 | Ht 59.0 in | Wt 153.0 lb

## 2021-04-20 DIAGNOSIS — Z124 Encounter for screening for malignant neoplasm of cervix: Secondary | ICD-10-CM

## 2021-04-20 DIAGNOSIS — N926 Irregular menstruation, unspecified: Secondary | ICD-10-CM

## 2021-04-20 DIAGNOSIS — Z1151 Encounter for screening for human papillomavirus (HPV): Secondary | ICD-10-CM

## 2021-04-20 DIAGNOSIS — Z30432 Encounter for removal of intrauterine contraceptive device: Secondary | ICD-10-CM | POA: Diagnosis not present

## 2021-04-20 LAB — POCT URINE PREGNANCY: Preg Test, Ur: NEGATIVE

## 2021-04-20 NOTE — Progress Notes (Signed)
Chief Complaint  Patient presents with   IUD removal    Not interested in new [facility], not having issues just wants it removed.     History of Present Illness:  Brittney Robinson is a 31 y.o. that had a Paragard IUD placed approximately 2 years ago. Menses are monthly, lasting 7 days, mod flow, mild dysmen. LMP was shorter. Has occas sharp pelvic pains. Pt's husband had vasectomy 11/22 and is submitting specimens to lab now. No pain/bleeding with sex. Pt would like IUD removed.  Neg pap 05/17/18.   Past Medical History:  Diagnosis Date   Anemia    COVID-19 01/21/2019   Past Surgical History:  Procedure Laterality Date   CHOLECYSTECTOMY  2013   Family History  Problem Relation Age of Onset   Heart disease Maternal Grandmother    Heart disease Maternal Grandfather    Heart disease Paternal Grandmother    Hypertension Paternal Grandmother    Heart disease Paternal Grandfather    Hypercholesterolemia Mother    Allergies Father    Bipolar disorder Sister    Kidney Stones Sister    Obesity Maternal Aunt    Hypertension Maternal Aunt    Schizophrenia Maternal Aunt    Obesity Paternal Uncle    Kidney Stones Sister   Review of Systems  Constitutional:  Negative for fever.  Gastrointestinal:  Negative for blood in stool, constipation, diarrhea, nausea and vomiting.  Genitourinary:  Negative for dyspareunia, dysuria, flank pain, frequency, hematuria, urgency, vaginal bleeding, vaginal discharge and vaginal pain.  Musculoskeletal:  Negative for back pain.  Skin:  Negative for rash.    BP 112/80    Ht 4\' 11"  (1.499 m)    Wt 153 lb (69.4 kg)    BMI 30.90 kg/m   Physical Exam Constitutional:      General: She is not in acute distress. Genitourinary:     Vulva normal.     Right Labia: No rash, tenderness or lesions.    Left Labia: No tenderness, lesions or rash.    No vaginal discharge, erythema, tenderness or bleeding.      Right Adnexa: not tender and no mass present.     Left Adnexa: not tender and no mass present.    No cervical motion tenderness or friability.     IUD strings visualized.     Uterus is not enlarged or tender.  Pulmonary:     Effort: Pulmonary effort is normal.  Musculoskeletal:        General: Normal range of motion.  Neurological:     General: No focal deficit present.     Mental Status: She is alert.     Cranial Nerves: No cranial nerve deficit.  Skin:    General: Skin is warm and dry.  Psychiatric:        Mood and Affect: Mood normal.        Behavior: Behavior normal.        Thought Content: Thought content normal.        Judgment: Judgment normal.  Vitals and nursing note reviewed.    Results for orders placed or performed in visit on 04/20/21 (from the past 24 hour(s))  POCT urine pregnancy     Status: Normal   Collection Time: 04/20/21 10:09 AM  Result Value Ref Range   Preg Test, Ur Negative Negative     IUD Removal Strings of IUD identified and grasped.  IUD removed without problem with ring forceps.  Pt tolerated this well.  IUD  noted to be intact.  Assessment:  Irregular menses - Plan: POCT urine pregnancy; LMP was short and light. Pt with Paragard IUD. Neg UPT. Reassurance.   Cervical cancer screening - Plan: Cytology - PAP  Screening for HPV (human papillomavirus) - Plan: Cytology - PAP  Encounter for IUD removal   Terrel Manalo B. Meyli Boice, PA-C 04/20/2021 4:06 PM

## 2021-04-20 NOTE — Patient Instructions (Signed)
I value your feedback and you entrusting us with your care. If you get a [facility] patient survey, I would appreciate you taking the time to let us know about your experience today. Thank you! ? ? ?

## 2021-04-21 LAB — CYTOLOGY - PAP
Comment: NEGATIVE
Diagnosis: NEGATIVE
High risk HPV: NEGATIVE

## 2022-01-02 ENCOUNTER — Ambulatory Visit
Admission: EM | Admit: 2022-01-02 | Discharge: 2022-01-02 | Disposition: A | Discharge status: Home / Self Care | Payer: Managed Care, Other (non HMO) | Attending: Emergency Medicine | Admitting: Emergency Medicine

## 2022-01-02 DIAGNOSIS — R300 Dysuria: Secondary | ICD-10-CM

## 2022-01-02 DIAGNOSIS — R809 Proteinuria, unspecified: Secondary | ICD-10-CM | POA: Diagnosis present

## 2022-01-02 LAB — POCT URINALYSIS DIP (MANUAL ENTRY)
Bilirubin, UA: NEGATIVE
Glucose, UA: NEGATIVE mg/dL
Ketones, POC UA: NEGATIVE mg/dL
Leukocytes, UA: NEGATIVE
Nitrite, UA: NEGATIVE
Protein Ur, POC: 30 mg/dL — AB
Spec Grav, UA: >=1.03 — AB (ref 1.010–1.025)
Urobilinogen, UA: 0.2 E.U./dL
pH, UA: 5.5 (ref 5.0–8.0)

## 2022-01-02 LAB — POCT URINE PREGNANCY: Preg Test, Ur: NEGATIVE

## 2022-01-02 NOTE — ED Provider Notes (Signed)
Roderic Palau    CSN: [id number] Arrival date & time: 01/02/22  1047      History   Chief Complaint Chief Complaint  Patient presents with   Dysuria    HPI Diann Bangerter is a 31 y.o. female.  Patient presents with 4-day history of dysuria.  No fever, chills, abdominal pain, flank pain, hematuria, vaginal discharge, pelvic pain, or other symptoms.  No treatments at home.  The history is provided by the patient and medical records.    Past Medical History:  Diagnosis Date   Anemia    COVID-19 01/21/2019    Patient Active Problem List   Diagnosis Date Noted   Atypical mole 05/10/2018   Refusal of blood transfusions as patient is Jehovah's Witness 04/22/2016    Past Surgical History:  Procedure Laterality Date   CHOLECYSTECTOMY  2013    OB History     Gravida  2   Para  2   Term  2   Preterm  0   AB  0   Living  2      SAB  0   IAB  0   Ectopic  0   Multiple      Live Births  2            Home Medications    Prior to Admission medications   Not on File    Family History Family History  Problem Relation Age of Onset   Heart disease Maternal Grandmother    Heart disease Maternal Grandfather    Heart disease Paternal Grandmother    Hypertension Paternal Grandmother    Heart disease Paternal Grandfather    Hypercholesterolemia Mother    Allergies Father    Bipolar disorder Sister    Kidney Stones Sister    Obesity Maternal Aunt    Hypertension Maternal Aunt    Schizophrenia Maternal Aunt    Obesity Paternal Uncle    Kidney Stones Sister     Social History Social History   Tobacco Use   Smoking status: Never   Smokeless tobacco: Never  Vaping Use   Vaping Use: Never used  Substance Use Topics   Alcohol use: No   Drug use: No     Allergies   Patient has no known allergies.   Review of Systems Review of Systems  Constitutional:  Negative for chills and fever.  Gastrointestinal:  Negative for  abdominal pain, diarrhea and vomiting.  Genitourinary:  Positive for dysuria. Negative for flank pain, hematuria, pelvic pain and vaginal discharge.  All other systems reviewed and are negative.    Physical Exam Triage Vital Signs ED Triage Vitals  Enc Vitals Group     BP 01/02/22 1058 104/71     Pulse Rate 01/02/22 1058 79     Resp 01/02/22 1058 17     Temp 01/02/22 1058 99 F (37.2 C)     Temp src --      SpO2 01/02/22 1058 98 %     Weight 01/02/22 1057 153 lb (69.4 kg)     Height 01/02/22 1057 '4\' 11"'$  (1.499 m)     Head Circumference --      Peak Flow --      Pain Score 01/02/22 1057 0     Pain Loc --      Pain Edu? --      Excl. in [place]? --    No data found.  Updated Vital Signs BP 104/71   Pulse 79  Temp 99 F (37.2 C)   Resp 17   Ht '4\' 11"'$  (1.499 m)   Wt 153 lb (69.4 kg)   LMP 12/03/2021   SpO2 98%   BMI 30.90 kg/m   Visual Acuity Right Eye Distance:   Left Eye Distance:   Bilateral Distance:    Right Eye Near:   Left Eye Near:    Bilateral Near:     Physical Exam Vitals and nursing note reviewed.  Constitutional:      General: She is not in acute distress.    Appearance: Normal appearance. She is well-developed. She is not ill-appearing.  HENT:     Mouth/Throat:     Mouth: Mucous membranes are moist.  Cardiovascular:     Rate and Rhythm: Normal rate and regular rhythm.     Heart sounds: Normal heart sounds.  Pulmonary:     Effort: Pulmonary effort is normal. No respiratory distress.     Breath sounds: Normal breath sounds.  Abdominal:     General: Bowel sounds are normal.     Palpations: Abdomen is soft.     Tenderness: There is no abdominal tenderness. There is no right CVA tenderness, left CVA tenderness, guarding or rebound.  Musculoskeletal:     Cervical back: Neck supple.  Skin:    General: Skin is warm and dry.  Neurological:     Mental Status: She is alert.  Psychiatric:        Mood and Affect: Mood normal.        Behavior:  Behavior normal.      UC Treatments / Results  Labs (all labs ordered are listed, but only abnormal results are displayed) Labs Reviewed  POCT URINALYSIS DIP (MANUAL ENTRY) - Abnormal; Notable for the following components:      Result Value   Spec Grav, UA >=1.030 (*)    Blood, UA trace-intact (*)    Protein Ur, POC =30 (*)    All other components within normal limits  URINE CULTURE  POCT URINE PREGNANCY    EKG   Radiology No results found.  Procedures Procedures (including critical care time)  Medications Ordered in UC Medications - No data to display  Initial Impression / Assessment and Plan / UC Course  I have reviewed the triage vital signs and the nursing notes.  Pertinent labs & imaging results that were available during my care of the patient were reviewed by me and considered in my medical decision making (see chart for details).    Dysuria, proteinuria.  Urine culture pending.  Instructed patient to increase her water intake.  Education provided on dysuria and proteinuria.  Instructed patient to follow-up with her PCP for recheck of her urine in 1 to 2 weeks.  She agrees to plan of care.  Final Clinical Impressions(s) / UC Diagnoses   Final diagnoses:  Dysuria  Proteinuria, unspecified type     Discharge Instructions      Your urine does not show signs of infection today.  A urine culture is pending.    Increase your water intake.  See the attached information on painful urination and protein in the urine.    Have your urine rechecked by your primary care provider in 1 to 2 weeks.      ED Prescriptions   None    PDMP not reviewed this encounter.   Sharion Balloon, NP 01/02/22 1148

## 2022-01-02 NOTE — ED Triage Notes (Signed)
Patient to Urgent Care with complaints of dysuria x4 days. Denies any known fevers. Denies any vaginal discharge.

## 2022-01-02 NOTE — Discharge Instructions (Addendum)
Your urine does not show signs of infection today.  A urine culture is pending.    Increase your water intake.  See the attached information on painful urination and protein in the urine.    Have your urine rechecked by your primary care provider in 1 to 2 weeks.

## 2022-01-03 LAB — URINE CULTURE: Culture: NO GROWTH

## 2022-01-04 ENCOUNTER — Telehealth: Payer: Self-pay | Admitting: Family Medicine

## 2022-01-04 NOTE — Telephone Encounter (Signed)
Copied from [place] [id number]. Topic: General - Other >> Jan 04, 2022 12:20 PM Everette C wrote: Reason for CRM: The patient would like to speak with a member of clinical staff regarding lab work that was conducted over the weekend  The patient had labs done on 01/02/22 at a [facility] and was directed to contact their PCP and have their labs rechecked when possible  Please contact the patient further

## 2022-01-04 NOTE — Telephone Encounter (Signed)
Lvm to return call to schedule appt per Dr Ancil Boozer

## 2022-01-04 NOTE — Telephone Encounter (Signed)
Left VM that pt needs to call back and schedule followup from Urgent Care to have labs repeated

## 2022-01-27 ENCOUNTER — Ambulatory Visit (INDEPENDENT_AMBULATORY_CARE_PROVIDER_SITE_OTHER): Payer: Managed Care, Other (non HMO) | Admitting: Nurse Practitioner

## 2022-01-27 ENCOUNTER — Other Ambulatory Visit: Payer: Self-pay

## 2022-01-27 VITALS — BP 120/72 | HR 83 | Temp 97.9°F | Resp 18 | Ht <= 58 in | Wt 154.2 lb

## 2022-01-27 DIAGNOSIS — Z13220 Encounter for screening for lipoid disorders: Secondary | ICD-10-CM | POA: Diagnosis not present

## 2022-01-27 DIAGNOSIS — R809 Proteinuria, unspecified: Secondary | ICD-10-CM | POA: Diagnosis not present

## 2022-01-27 DIAGNOSIS — N926 Irregular menstruation, unspecified: Secondary | ICD-10-CM

## 2022-01-27 DIAGNOSIS — Z23 Encounter for immunization: Secondary | ICD-10-CM

## 2022-01-27 DIAGNOSIS — Z130 Encounter for screening for diseases of the blood and blood-forming organs and certain disorders involving the immune mechanism: Secondary | ICD-10-CM | POA: Diagnosis not present

## 2022-01-27 DIAGNOSIS — Z131 Encounter for screening for diabetes mellitus: Secondary | ICD-10-CM | POA: Diagnosis not present

## 2022-01-27 LAB — POCT URINALYSIS DIPSTICK
Bilirubin, UA: NEGATIVE
Blood, UA: NEGATIVE
Glucose, UA: NEGATIVE
Ketones, UA: NEGATIVE
Leukocytes, UA: NEGATIVE
Nitrite, UA: NEGATIVE
Protein, UA: NEGATIVE
Spec Grav, UA: 1.01 (ref 1.010–1.025)
Urobilinogen, UA: 0.2 E.U./dL
pH, UA: 5 (ref 5.0–8.0)

## 2022-01-27 LAB — POCT URINE PREGNANCY: Preg Test, Ur: NEGATIVE

## 2022-01-27 NOTE — Progress Notes (Signed)
BP 120/72   Pulse 83   Temp 97.9 F (36.6 C) (Oral)   Resp 18   Ht '4\' 10"'$  (1.473 m)   Wt 154 lb 3.2 oz (69.9 kg)   LMP 01/04/2022   SpO2 98%   BMI 32.23 kg/m    Subjective:    Patient ID: Brittney Robinson, female    DOB: [birth year], 31 y.o.   MRN: [id number]  HPI: Brittney Robinson is a 31 y.o. female  Chief Complaint  Patient presents with   abnormal labs    Seen at [facility]   Urgent care follow up/ proteinuria:  Patient was seen by urgent care on 01/02/2022. She was seen for a 4 day history of dysuria. Urine dip showed protein 30, blood trace.  Urine culture was negative.  Patient reports she is feeling much better.  Will get repeat urine dip. Urine dip was negative.  Reassurance provided to patient.   Irregular period:  She says she has not had a period in tow months. She says that she had some spotting when she was supposed to have her period then it stopped and she has not had one in two months.  [facility]: she does not remember, maybe September.  She does have an OBGYN Dr. Lorelei Pont.  Recommend she call and get an appointment. Will check urine hcg, which was negative and get TSH.  Relevant past medical, surgical, family and social history reviewed and updated as indicated. Interim medical history since our last visit reviewed. Allergies and medications reviewed and updated.  Review of Systems  Constitutional: Negative for fever or weight change.  Respiratory: Negative for cough and shortness of breath.   Cardiovascular: Negative for chest pain or palpitations.  Gastrointestinal: Negative for abdominal pain, no bowel changes.  Musculoskeletal: Negative for gait problem or joint swelling.  Skin: Negative for rash.  Neurological: Negative for dizziness or headache.  No other specific complaints in a complete review of systems (except as listed in HPI above).      Objective:    BP 120/72   Pulse 83   Temp 97.9 F (36.6 C) (Oral)   Resp 18   Ht 4'  10" (1.473 m)   Wt 154 lb 3.2 oz (69.9 kg)   LMP 01/04/2022   SpO2 98%   BMI 32.23 kg/m   Wt Readings from Last 3 Encounters:  01/27/22 154 lb 3.2 oz (69.9 kg)  01/02/22 153 lb (69.4 kg)  04/20/21 153 lb (69.4 kg)    Physical Exam  Constitutional: Patient appears well-developed and well-nourished. Obese  No distress.  HEENT: head atraumatic, normocephalic, pupils equal and reactive to light,  neck supple Cardiovascular: Normal rate, regular rhythm and normal heart sounds.  No murmur heard. No BLE edema. Pulmonary/Chest: Effort normal and breath sounds normal. No respiratory distress. Abdominal: Soft.  There is no tenderness. NO CVA tenderness Psychiatric: Patient has a normal mood and affect. behavior is normal. Judgment and thought content normal.  Results for orders placed or performed in visit on 01/27/22  POCT urinalysis dipstick  Result Value Ref Range   Color, UA yellow    Clarity, UA clear    Glucose, UA Negative Negative   Bilirubin, UA negative    Ketones, UA negative    Spec Grav, UA 1.010 1.010 - 1.025   Blood, UA negative    pH, UA 5.0 5.0 - 8.0   Protein, UA Negative Negative   Urobilinogen, UA 0.2 0.2 or 1.0 E.U./dL   Nitrite,  UA negative    Leukocytes, UA Negative Negative   Appearance clear    Odor none   POCT urine pregnancy  Result Value Ref Range   Preg Test, Ur Negative Negative      Assessment & Plan:   Problem List Items Addressed This Visit   None Visit Diagnoses     Proteinuria, unspecified type    -  Primary   resolved, reassurance provided to patient   Relevant Orders   POCT urinalysis dipstick (Completed)   Screening for diabetes mellitus       Relevant Orders   COMPLETE METABOLIC PANEL WITH GFR   Hemoglobin A1c   Screening for cholesterol level       Relevant Orders   Lipid panel   Screening for deficiency anemia       Relevant Orders   CBC with Differential/Platelet   Need for influenza vaccination       Relevant Orders   Flu  Vaccine QUAD 6+ mos PF IM (Fluarix Quad PF) (Completed)   Irregular periods       getting labs, urine hcg negative, recommend follow up with GYN   Relevant Orders   TSH   POCT urine pregnancy (Completed)        Follow up plan: Return for due for cpe, with Dr. Ancil Boozer, cpe.

## 2022-01-28 ENCOUNTER — Encounter: Payer: Self-pay | Admitting: Nurse Practitioner

## 2022-01-28 LAB — LIPID PANEL
Cholesterol: 161 mg/dL (ref <200)
HDL: 61 mg/dL (ref >=50)
LDL Cholesterol (Calc): 75 mg/dL (calc)
Non-HDL Cholesterol (Calc): 100 mg/dL (calc) (ref <130)
Total CHOL/HDL Ratio: 2.6 (calc) (ref <5.0)
Triglycerides: 158 mg/dL — ABNORMAL HIGH (ref <150)

## 2022-01-28 LAB — CBC WITH DIFFERENTIAL/PLATELET
Absolute Monocytes: 504 cells/uL (ref 200–950)
Basophils Absolute: 64 cells/uL (ref 0–200)
Basophils Relative: 0.8 %
Eosinophils Absolute: 232 cells/uL (ref 15–500)
Eosinophils Relative: 2.9 %
HCT: 40.4 % (ref 35.0–45.0)
Hemoglobin: 13.7 g/dL (ref 11.7–15.5)
Lymphs Abs: 4160 cells/uL — ABNORMAL HIGH (ref 850–3900)
MCH: 29.9 pg (ref 27.0–33.0)
MCHC: 33.9 g/dL (ref 32.0–36.0)
MCV: 88.2 fL (ref 80.0–100.0)
MPV: 10.9 fL (ref 7.5–12.5)
Monocytes Relative: 6.3 %
Neutro Abs: 3040 cells/uL (ref 1500–7800)
Neutrophils Relative %: 38 %
Platelets: 278 10*3/uL (ref 140–400)
RBC: 4.58 10*6/uL (ref 3.80–5.10)
RDW: 12.3 % (ref 11.0–15.0)
Total Lymphocyte: 52 %
WBC: 8 10*3/uL (ref 3.8–10.8)

## 2022-01-28 LAB — COMPLETE METABOLIC PANEL WITH GFR
AG Ratio: 1.5 (calc) (ref 1.0–2.5)
ALT: 53 U/L — ABNORMAL HIGH (ref 6–29)
AST: 26 U/L (ref 10–30)
Albumin: 4.8 g/dL (ref 3.6–5.1)
Alkaline phosphatase (APISO): 42 U/L (ref 31–125)
BUN: 11 mg/dL (ref 7–25)
CO2: 28 mmol/L (ref 20–32)
Calcium: 10.1 mg/dL (ref 8.6–10.2)
Chloride: 103 mmol/L (ref 98–110)
Creat: 0.58 mg/dL (ref 0.50–0.97)
Globulin: 3.1 g/dL (calc) (ref 1.9–3.7)
Glucose, Bld: 83 mg/dL (ref 65–99)
Potassium: 4.2 mmol/L (ref 3.5–5.3)
Sodium: 138 mmol/L (ref 135–146)
Total Bilirubin: 0.4 mg/dL (ref 0.2–1.2)
Total Protein: 7.9 g/dL (ref 6.1–8.1)
eGFR: 124 mL/min/{1.73_m2} (ref >=60)

## 2022-01-28 LAB — HEMOGLOBIN A1C
Hgb A1c MFr Bld: 5.5 % of total Hgb (ref <5.7)
Mean Plasma Glucose: 111 mg/dL
eAG (mmol/L): 6.2 mmol/L

## 2022-01-28 LAB — TSH: TSH: 2.31 mIU/L

## 2022-02-08 ENCOUNTER — Ambulatory Visit: Payer: Self-pay | Admitting: *Deleted

## 2022-02-08 NOTE — Telephone Encounter (Signed)
Pt given lab results per notes of J. Reece Packer, [place] from 01/28/22 on 02/08/22. Pt verbalized understanding and reports she did fast for labs only drank water and coffee. No reports of adding anything in coffee.

## 2022-03-12 NOTE — Progress Notes (Deleted)
Name: Obie Silos   MRN: [id number]    DOB: 03-02-1991   Date:03/12/2022       Progress Note  Subjective  Chief Complaint  Annual Exam  HPI  Patient presents for annual CPE.  Diet: *** Exercise: ***  Last Eye Exam: *** Last Dental Exam: ***  Flowsheet Row Office Visit from 01/27/2022 in [facility]  AUDIT-C Score 0      Depression: Phq 9 is  {Desc; negative/positive:13464}    01/27/2022    9:18 AM 12/19/2020   10:51 AM 05/22/2020    8:44 AM 05/07/2019   10:48 AM 03/06/2019   11:52 AM  Depression screen PHQ 2/9  Decreased Interest 0 0 0 0 0  Down, Depressed, Hopeless 0 0 0 0 0  PHQ - 2 Score 0 0 0 0 0  Altered sleeping  0 0 0 0  Tired, decreased energy  0 0 0 0  Change in appetite  0 0 0 0  Feeling bad or failure about yourself   0 0 0 0  Trouble concentrating  0 0 0 0  Moving slowly or fidgety/restless  0 0 0 0  Suicidal thoughts  0 0 0 0  PHQ-9 Score  0 0 0 0  Difficult doing work/chores  Not difficult at all Not difficult at all Not difficult at all    Hypertension: BP Readings from Last 3 Encounters:  01/27/22 120/72  01/02/22 104/71  04/20/21 112/80   Obesity: Wt Readings from Last 3 Encounters:  01/27/22 154 lb 3.2 oz (69.9 kg)  01/02/22 153 lb (69.4 kg)  04/20/21 153 lb (69.4 kg)   BMI Readings from Last 3 Encounters:  01/27/22 32.23 kg/m  01/02/22 30.90 kg/m  04/20/21 30.90 kg/m     Vaccines:   HPV: up to date Tdap: up to date Shingrix: N/A Pneumonia: N/A Flu: up to date COVID-19: up to date   Hep C Screening: 05/10/18 STD testing and prevention (HIV/chl/gon/syphilis): 05/07/19 Intimate partner violence: negative screen  Sexual History : Menstrual History/LMP/Abnormal Bleeding:  Discussed importance of follow up if any post-menopausal bleeding: yes  Incontinence Symptoms: negative for symptoms   Breast cancer:  - Last Mammogram: N/A - BRCA gene screening: N/A   Osteoporosis Prevention :  Discussed high calcium and vitamin D supplementation, weight bearing exercises Bone density: N/A   Cervical cancer screening: 04/20/21  Skin cancer: Discussed monitoring for atypical lesions  Colorectal cancer: N/A   Lung cancer:  Low Dose CT Chest recommended if Age 68-80 years, 20 pack-year currently smoking OR have quit w/in 15years. Patient does not qualify for screen   ECG: N/A  Advanced Care Planning: A voluntary discussion about advance care planning including the explanation and discussion of advance directives.  Discussed health care proxy and Living will, and the patient was able to identify a health care proxy as ***.  Patient does not have a living will and power of attorney of health care   Lipids: Lab Results  Component Value Date   CHOL 161 01/27/2022   CHOL 155 05/22/2020   CHOL 148 05/07/2019   Lab Results  Component Value Date   HDL 61 01/27/2022   HDL 57 05/22/2020   HDL 65 05/07/2019   Lab Results  Component Value Date   LDLCALC 75 01/27/2022   LDLCALC 74 05/22/2020   LDLCALC 62 05/07/2019   Lab Results  Component Value Date   TRIG 158 (H) 01/27/2022   TRIG 161 (H) 05/22/2020  TRIG 128 05/07/2019   Lab Results  Component Value Date   CHOLHDL 2.6 01/27/2022   CHOLHDL 2.7 05/22/2020   CHOLHDL 2.3 05/07/2019   No results found for: "LDLDIRECT"  Glucose: Glucose  Date Value Ref Range Status  06/22/2014 103 (H) mg/dL Final    Comment:    65-99 NOTE: New Reference Range  05/21/14    Glucose, Bld  Date Value Ref Range Status  01/27/2022 83 65 - 99 mg/dL Final    Comment:    .            Fasting reference interval .   05/22/2020 82 65 - 99 mg/dL Final    Comment:    .            Fasting reference interval .   05/07/2019 79 65 - 99 mg/dL Final    Comment:    .            Fasting reference interval .     Patient Active Problem List   Diagnosis Date Noted   Atypical mole 05/10/2018   Refusal of blood transfusions as patient is  Jehovah's Witness 04/22/2016    Past Surgical History:  Procedure Laterality Date   CHOLECYSTECTOMY  2013    Family History  Problem Relation Age of Onset   Heart disease Maternal Grandmother    Heart disease Maternal Grandfather    Heart disease Paternal Grandmother    Hypertension Paternal Grandmother    Heart disease Paternal Grandfather    Hypercholesterolemia Mother    Allergies Father    Bipolar disorder Sister    Kidney Stones Sister    Obesity Maternal Aunt    Hypertension Maternal Aunt    Schizophrenia Maternal Aunt    Obesity Paternal Uncle    Kidney Stones Sister     Social History   Socioeconomic History   Marital status: Married    Spouse name: Jose    Number of children: 2   Years of education: Not on file   Highest education level: 12th grade  Occupational History   Occupation: CNA  Tobacco Use   Smoking status: Never   Smokeless tobacco: Never  Vaping Use   Vaping Use: Never used  Substance and Sexual Activity   Alcohol use: No   Drug use: No   Sexual activity: Yes    Partners: Male    Birth control/protection: I.U.D.  Other Topics Concern   Not on file  Social History Narrative   ** Merged History Encounter **       Born in [place], both parents from [place].  Moved to [place] with parents around age 72 She married in [place] also from [place] and they have 2 sons   Social Determinants of Health   Financial Resource Strain: Low Risk  (05/22/2020)   Overall Financial Resource Strain (CARDIA)    Difficulty of Paying Living Expenses: Not hard at all  Food Insecurity: No Food Insecurity (05/22/2020)   Hunger Vital Sign    Worried About Running Out of Food in the Last Year: Never true    [place] in the Last Year: Never true  Transportation Needs: No Transportation Needs (05/22/2020)   PRAPARE - Hydrologist (Medical): No    Lack of Transportation (Non-Medical): No  Physical Activity: Sufficiently Active (05/22/2020)    Exercise Vital Sign    Days of Exercise per Week: 3 days    Minutes of Exercise per Session: 60  min  Stress: No Stress Concern Present (05/22/2020)   [place]    Feeling of Stress : Only a little  Social Connections: Moderately Integrated (05/22/2020)   Social Connection and Isolation Panel [NHANES]    Frequency of Communication with Friends and Family: Twice a week    Frequency of Social Gatherings with Friends and Family: Twice a week    Attends Religious Services: 1 to 4 times per year    Active Member of [employer] or Organizations: No    Attends Archivist Meetings: Never    Marital Status: Married  Human resources officer Violence: Not At Risk (05/22/2020)   Humiliation, Afraid, Rape, and Kick questionnaire    Fear of Current or Ex-Partner: No    Emotionally Abused: No    Physically Abused: No    Sexually Abused: No    No current outpatient medications on file.  No Known Allergies   ROS  ***  Objective  There were no vitals filed for this visit.  There is no height or weight on file to calculate BMI.  Physical Exam ***  Recent Results (from the past 2160 hour(s))  POCT urine pregnancy     Status: None   Collection Time: 01/02/22 11:04 AM  Result Value Ref Range   Preg Test, Ur Negative Negative  POCT urinalysis dipstick     Status: Abnormal   Collection Time: 01/02/22 11:04 AM  Result Value Ref Range   Color, UA yellow yellow   Clarity, UA clear clear   Glucose, UA negative negative mg/dL   Bilirubin, UA negative negative   Ketones, POC UA negative negative mg/dL   Spec Grav, UA >=1.030 (A) 1.010 - 1.025   Blood, UA trace-intact (A) negative   pH, UA 5.5 5.0 - 8.0   Protein Ur, POC =30 (A) negative mg/dL   Urobilinogen, UA 0.2 0.2 or 1.0 E.U./dL   Nitrite, UA Negative Negative   Leukocytes, UA Negative Negative  Urine Culture     Status: None   Collection Time: 01/02/22 11:14 AM    Specimen: Urine, Clean Catch  Result Value Ref Range   Specimen Description URINE, CLEAN CATCH    Special Requests NONE    Culture      NO GROWTH Performed at [facility], [address], [place], [place] [address]    Report Status 01/03/2022 FINAL   POCT urinalysis dipstick     Status: None   Collection Time: 01/27/22  9:32 AM  Result Value Ref Range   Color, UA yellow    Clarity, UA clear    Glucose, UA Negative Negative   Bilirubin, UA negative    Ketones, UA negative    Spec Grav, UA 1.010 1.010 - 1.025   Blood, UA negative    pH, UA 5.0 5.0 - 8.0   Protein, UA Negative Negative   Urobilinogen, UA 0.2 0.2 or 1.0 E.U./dL   Nitrite, UA negative    Leukocytes, UA Negative Negative   Appearance clear    Odor none   POCT urine pregnancy     Status: None   Collection Time: 01/27/22  9:48 AM  Result Value Ref Range   Preg Test, Ur Negative Negative  CBC with Differential/Platelet     Status: Abnormal   Collection Time: 01/27/22  9:59 AM  Result Value Ref Range   WBC 8.0 3.8 - 10.8 Thousand/uL   RBC 4.58 3.80 - 5.10 Million/uL   Hemoglobin  13.7 11.7 - 15.5 g/dL   HCT 40.4 35.0 - 45.0 %   MCV 88.2 80.0 - 100.0 fL   MCH 29.9 27.0 - 33.0 pg   MCHC 33.9 32.0 - 36.0 g/dL   RDW 12.3 11.0 - 15.0 %   Platelets 278 140 - 400 Thousand/uL   MPV 10.9 7.5 - 12.5 fL   Neutro Abs 3,040 1,500 - 7,800 cells/uL   Lymphs Abs 4,160 (H) 850 - 3,900 cells/uL   Absolute Monocytes 504 200 - 950 cells/uL   Eosinophils Absolute 232 15 - 500 cells/uL   Basophils Absolute 64 0 - 200 cells/uL   Neutrophils Relative % 38 %   Total Lymphocyte 52.0 %   Monocytes Relative 6.3 %   Eosinophils Relative 2.9 %   Basophils Relative 0.8 %  COMPLETE METABOLIC PANEL WITH GFR     Status: Abnormal   Collection Time: 01/27/22  9:59 AM  Result Value Ref Range   Glucose, Bld 83 65 - 99 mg/dL    Comment: .            Fasting reference interval .    BUN 11 7 - 25 mg/dL   Creat 0.58 0.50 - 0.97 mg/dL    eGFR 124 > OR = 60 mL/min/1.11m   BUN/Creatinine Ratio SEE NOTE: 6 - 22 (calc)    Comment:    Not Reported: BUN and Creatinine are within    reference range. .    Sodium 138 135 - 146 mmol/L   Potassium 4.2 3.5 - 5.3 mmol/L   Chloride 103 98 - 110 mmol/L   CO2 28 20 - 32 mmol/L   Calcium 10.1 8.6 - 10.2 mg/dL   Total Protein 7.9 6.1 - 8.1 g/dL   Albumin 4.8 3.6 - 5.1 g/dL   Globulin 3.1 1.9 - 3.7 g/dL (calc)   AG Ratio 1.5 1.0 - 2.5 (calc)   Total Bilirubin 0.4 0.2 - 1.2 mg/dL   Alkaline phosphatase (APISO) 42 31 - 125 U/L   AST 26 10 - 30 U/L   ALT 53 (H) 6 - 29 U/L  Lipid panel     Status: Abnormal   Collection Time: 01/27/22  9:59 AM  Result Value Ref Range   Cholesterol 161 <200 mg/dL   HDL 61 > OR = 50 mg/dL   Triglycerides 158 (H) <150 mg/dL   LDL Cholesterol (Calc) 75 mg/dL (calc)    Comment: Reference range: <100 . Desirable range <100 mg/dL for primary prevention;   <70 mg/dL for patients with CHD or diabetic patients  with > or = 2 CHD risk factors. .Marland KitchenLDL-C is now calculated using the Martin-Hopkins  calculation, which is a validated novel method providing  better accuracy than the Friedewald equation in the  estimation of LDL-C.  MCresenciano Genreet al. JAnnamaria Helling [id number];315(40: 2061-2068  (http://education.[facility].com/faq/FAQ164)    Total CHOL/HDL Ratio 2.6 <5.0 (calc)   Non-HDL Cholesterol (Calc) 100 <130 mg/dL (calc)    Comment: For patients with diabetes plus 1 major ASCVD risk  factor, treating to a non-HDL-C goal of <100 mg/dL  (LDL-C of <70 mg/dL) is considered a therapeutic  option.   Hemoglobin A1c     Status: None   Collection Time: 01/27/22  9:59 AM  Result Value Ref Range   Hgb A1c MFr Bld 5.5 <5.7 % of total Hgb    Comment: For the purpose of screening for the presence of diabetes: . <5.7%       Consistent with the  absence of diabetes 5.7-6.4%    Consistent with increased risk for diabetes             (prediabetes) > or =6.5%  Consistent  with diabetes . This assay result is consistent with a decreased risk of diabetes. . Currently, no consensus exists regarding use of hemoglobin A1c for diagnosis of diabetes in children. . According to American Diabetes Association (ADA) guidelines, hemoglobin A1c <7.0% represents optimal control in non-pregnant diabetic patients. Different metrics may apply to specific patient populations.  Standards of Medical Care in Diabetes(ADA). .    Mean Plasma Glucose 111 mg/dL   eAG (mmol/L) 6.2 mmol/L  TSH     Status: None   Collection Time: 01/27/22  9:59 AM  Result Value Ref Range   TSH 2.31 mIU/L    Comment:           Reference Range .           > or = 20 Years  0.40-4.50 .                Pregnancy Ranges           First trimester    0.26-2.66           Second trimester   0.55-2.73           Third trimester    0.43-2.91      Fall Risk:    01/27/2022    9:17 AM 12/19/2020   10:51 AM 05/22/2020    8:44 AM 05/07/2019   10:48 AM 03/06/2019   11:52 AM  Fall Risk   Falls in the past year? 0 0 0 0 0  Number falls in past yr: 0 0 0 0 0  Injury with Fall? 0 0 0 0 0  Risk for fall due to :  No Fall Risks     Follow up Falls evaluation completed Falls prevention discussed  Falls evaluation completed      Functional Status Survey:     Assessment & Plan  1. Well adult exam ***   -USPSTF grade A and B recommendations reviewed with patient; age-appropriate recommendations, preventive care, screening tests, etc discussed and encouraged; healthy living encouraged; see AVS for patient education given to patient -Discussed importance of 150 minutes of physical activity weekly, eat two servings of fish weekly, eat one serving of tree nuts ( cashews, pistachios, pecans, almonds.Marland Kitchen) every other day, eat 6 servings of fruit/vegetables daily and drink plenty of water and avoid sweet beverages.   -Reviewed Health Maintenance: Yes.

## 2022-03-12 NOTE — Patient Instructions (Incomplete)

## 2022-03-16 ENCOUNTER — Encounter: Payer: Managed Care, Other (non HMO) | Admitting: Family Medicine
